# Patient Record
Sex: Female | Born: 1995 | Race: Black or African American | Hispanic: No | Marital: Single | Smoking: Never smoker
Health system: Southern US, Community
[De-identification: ages and names within clinical notes are randomized; demographics above are authoritative.]

## PROBLEM LIST (undated history)

## (undated) DIAGNOSIS — G43909 Migraine, unspecified, not intractable, without status migrainosus: Secondary | ICD-10-CM

## (undated) DIAGNOSIS — T7840XA Allergy, unspecified, initial encounter: Secondary | ICD-10-CM

## (undated) DIAGNOSIS — F909 Attention-deficit hyperactivity disorder, unspecified type: Secondary | ICD-10-CM

## (undated) HISTORY — PX: MASTECTOMY: SHX3

---
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
## 2017-11-23 ENCOUNTER — Other Ambulatory Visit: Payer: Self-pay

## 2017-11-23 ENCOUNTER — Encounter (HOSPITAL_BASED_OUTPATIENT_CLINIC_OR_DEPARTMENT_OTHER): Payer: Self-pay | Admitting: *Deleted

## 2017-11-23 ENCOUNTER — Emergency Department (HOSPITAL_BASED_OUTPATIENT_CLINIC_OR_DEPARTMENT_OTHER)
Admission: EM | Admit: 2017-11-23 | Discharge: 2017-11-23 | Disposition: A | Discharge status: Home / Self Care | Payer: 59 | Attending: Emergency Medicine | Admitting: Emergency Medicine

## 2017-11-23 DIAGNOSIS — J069 Acute upper respiratory infection, unspecified: Secondary | ICD-10-CM

## 2017-11-23 DIAGNOSIS — R0981 Nasal congestion: Secondary | ICD-10-CM | POA: Diagnosis present

## 2017-11-23 DIAGNOSIS — R300 Dysuria: Secondary | ICD-10-CM | POA: Diagnosis not present

## 2017-11-23 LAB — URINALYSIS, ROUTINE W REFLEX MICROSCOPIC
Bilirubin Urine: NEGATIVE
Glucose, UA: NEGATIVE mg/dL
KETONES UR: NEGATIVE mg/dL
LEUKOCYTES UA: NEGATIVE
Nitrite: NEGATIVE
PROTEIN: NEGATIVE mg/dL
Specific Gravity, Urine: 1.015 (ref 1.005–1.030)
pH: 7.5 (ref 5.0–8.0)

## 2017-11-23 LAB — URINALYSIS, MICROSCOPIC (REFLEX)

## 2017-11-23 MED ORDER — FLUTICASONE FUROATE 50 MCG/ACT IN AEPB: 1.0000 | INHALATION_SPRAY | Freq: Two times a day (BID) | RESPIRATORY_TRACT | 0 refills | Status: AC

## 2017-11-23 NOTE — ED Notes (Signed)
Denies fever, denies bodyaches. C/o "cramps". States she is Nexplanon and it is due for replacement in a couple months

## 2017-11-23 NOTE — ED Provider Notes (Signed)
[facility] EMERGENCY DEPARTMENT Provider Note   CSN: [id number] Arrival date & time: 11/23/17  1337     History   Chief Complaint Chief Complaint  Patient presents with  . Cough    HPI Joan Espinoza is a 22 y.o. female with no significant medical history who presents for evaluation of congestion and dysuria.  Patient states she has had nasal congestion x2 days.  Patient states she has difficulty breathing out of her nose due to rhinorrhea and congestion.  States she feels like when she lays down that her nasal discharge trips to the back of her throat causes her to cough.  Cough is not productive of green or yellow sputum.  Denies fever, chills, sore throat, chest pain, shortness of breath, abdominal pain.  Admits to dysuria x1 day, Denies vaginal discharge, pelvic pain..  Denies flank pain.  Patient states she has a Nexplanon, however it is due to be replaced.  Denies any pain, difficulty swallowing, pain with swallowing, difficulty tolerating secretions..  Denies aggravating or alleviating factors.  Patient states she has not taken anything OTC for her symptoms.  History obtained from patient.  No interpreter was used.  HPI  Past Medical History:  Diagnosis Date  . ADHD (attention deficit hyperactivity disorder)    ADHD  . Allergy    Seasonal     There are no active problems to display for this patient.   Past Surgical History:  Procedure Laterality Date  . BREAST REDUCTION SURGERY Bilateral 10/22/2014   Procedure: BILATERAL BREAST REDUCTION ;  Surgeon: Eloise Levels, MD;  Location: [facility];  Service: Plastics;  Laterality: Bilateral;     OB History   None      Home Medications    Prior to Admission medications   Medication Sig Start Date End Date Taking? Authorizing Provider  Fluticasone Furoate 50 MCG/ACT AEPB Place 1 spray into both nostrils 2 (two) times daily for 7 days. 11/23/17 11/30/17  Obediah Welles A, PA-C    NEXPLANON 68 MG IMPL implant 68 mg by Implant route continuous. 09/03/15   [provider]    Family History Family History  Problem Relation Age of Onset  . Heart disease Maternal Grandmother   . Hypertension Maternal Grandmother   . Diabetes Paternal Grandmother   . Hypertension Paternal Grandfather     Social History Social History   Tobacco Use  . Smoking status: Never Smoker  . Smokeless tobacco: Never Used  Substance Use Topics  . Alcohol use: No  . Drug use: Yes    Types: Marijuana     Allergies   Patient has no known allergies.   Review of Systems Review of Systems  Constitutional: Negative.   HENT: Positive for congestion, postnasal drip and rhinorrhea. Negative for dental problem, drooling, ear discharge, ear pain, facial swelling, hearing loss, mouth sores, nosebleeds, sinus pressure, sinus pain, sneezing, sore throat, tinnitus, trouble swallowing and voice change.   Respiratory: Negative.   Cardiovascular: Negative.   Gastrointestinal: Negative.   Genitourinary: Positive for dysuria. Negative for difficulty urinating, flank pain, hematuria, menstrual problem, pelvic pain, vaginal bleeding, vaginal discharge and vaginal pain.  Musculoskeletal: Negative.   Skin: Negative.   All other systems reviewed and are negative.    Physical Exam Updated Vital Signs BP 129/87 (BP Location: Right Arm)   Pulse 87   Temp 99.2 F (37.3 C) (Oral)   Resp 20   Ht 5\' 5"  (1.651 m)   Wt (!) 139.5  kg   LMP 10/21/2017   SpO2 100%   BMI 51.17 kg/m   Physical Exam  Constitutional: Vital signs are normal. She appears well-developed and well-nourished.  Non-toxic appearance. She does not have a sickly appearance. She does not appear ill. No distress.  HENT:  Head: Normocephalic and atraumatic.  Right Ear: Tympanic membrane, external ear and ear canal normal. No drainage or tenderness. Tympanic membrane is not injected, not scarred, not perforated, not  erythematous, not retracted and not bulging.  Left Ear: Tympanic membrane, external ear and ear canal normal. No drainage or tenderness. Tympanic membrane is not injected, not scarred, not perforated, not erythematous, not retracted and not bulging.  Nose: Mucosal edema and rhinorrhea present. No sinus tenderness, nasal deformity, septal deviation or nasal septal hematoma. No epistaxis.  No foreign bodies. Right sinus exhibits no maxillary sinus tenderness and no frontal sinus tenderness. Left sinus exhibits no maxillary sinus tenderness and no frontal sinus tenderness.  Mouth/Throat: Uvula is midline and mucous membranes are normal. No oral lesions. No trismus in the jaw. No dental abscesses or uvula swelling. Posterior oropharyngeal erythema present. No oropharyngeal exudate, posterior oropharyngeal edema or tonsillar abscesses. No tonsillar exudate.  Eyes: Pupils are equal, round, and reactive to light.  Neck: Normal range of motion, full passive range of motion without pain and phonation normal. Neck supple. No spinous process tenderness and no muscular tenderness present. No neck rigidity. No edema, no erythema and normal range of motion present.  Cardiovascular: Normal rate, normal heart sounds, intact distal pulses and normal pulses.  Pulmonary/Chest: Effort normal and breath sounds normal. No accessory muscle usage or stridor. No tachypnea. No respiratory distress. She has no decreased breath sounds. She has no wheezes. She has no rhonchi. She has no rales. She exhibits no tenderness and no crepitus.  Abdominal: Soft. Normal appearance and bowel sounds are normal. She exhibits no distension, no fluid wave and no ascites. There is no hepatosplenomegaly. There is no tenderness. There is no rigidity, no rebound, no guarding and no CVA tenderness.  Musculoskeletal: Normal range of motion.       Lumbar back: Normal.  Neurological: She is alert.  Skin: Skin is warm and dry. No rash noted. She is not  diaphoretic.  Psychiatric: She has a normal mood and affect.  Nursing note and vitals reviewed.    ED Treatments / Results  Labs (all labs ordered are listed, but only abnormal results are displayed) Labs Reviewed  URINALYSIS, ROUTINE W REFLEX MICROSCOPIC - Abnormal; Notable for the following components:      Result Value   Hgb urine dipstick SMALL (*)    All other components within normal limits  URINALYSIS, MICROSCOPIC (REFLEX) - Abnormal; Notable for the following components:   Bacteria, UA RARE (*)    All other components within normal limits  PREGNANCY, URINE    EKG None  Radiology No results found.  Procedures Procedures (including critical care time)  Medications Ordered in ED Medications - No data to display   Initial Impression / Assessment and Plan / ED Course  I have reviewed the triage vital signs and the nursing notes.  Pertinent labs & imaging results that were available during my care of the patient were reviewed by me and considered in my medical decision making (see chart for details).  22 year old female who appears otherwise well presents for evaluation of upper respiratory symptoms and dysuria.  Afebrile, nonseptic, non-ill-appearing.  Patient with congestion and rhinorrhea on exam.  Posterior oropharynx  erythematous without edema.  No tonsillar edema, erythema or exudates.  Uvula is midline.  Neck without rigidity and nontender.  Low suspicion for PTA, Ludwig's angina.  No submandibular swelling or facial brawning.  Exam consistent with upper respiratory infection. Lungs clear to ascultation bilaterally, cough only present with nasal congestion to back of throat. Low suspicion for pneumonia. Patient with dysuria.  Abdomen nontender without rebound or guarding.  No pelvic pain or vaginal discharge.  Patient states she is not concerned about STDs.  Will obtain urinalysis and reevaluate.  Urinalysis negative for infection, urine pregnancy pending at D/C.  Patient does not want to wait for result. Requesting D/C home at this time. Discussed risk vs benefit and patient continues to request dc home at this time. Does not was STD testing. Patient is nontoxic, nonseptic appearing, in no apparent distress.  Patient does not meet the SIRS or Sepsis criteria.  On repeat exam patient does not have a surgical abdomin and there are no peritoneal signs.  No indication of appendicitis, bowel obstruction, bowel perforation, cholecystitis, diverticulitis. Patient symptoms consistent with upper respiratory infection.  Discussed with patient this does not need antibiotics.  Will treat with symptomatic management and outpatient follow-up with PCP.  Discussed reasons to return to the emergency department.  Patient voiced understanding is agreeable for follow-up.  Patient hemodynamically stable and appropriate for DC home at this time    Final Clinical Impressions(s) / ED Diagnoses   Final diagnoses:  URI (upper respiratory infection)    ED Discharge Orders         Ordered    Fluticasone Furoate 50 MCG/ACT AEPB  2 times daily     11/23/17 1612           Naylee Frankowski A, PA-C 11/23/17 1619    Tilden Fossa, MD 11/24/17 [phone]

## 2017-11-23 NOTE — Discharge Instructions (Addendum)
You were evaluated today for upper respiratory symptoms. This is most likely viral in nature. You may take over the counter musinex for symptoms management. I have also prescribed Flonase for your nasal symptoms.  Urinalysis was negative for infection.  If your symptoms worsen please see PCP for reevaluation.  Return to the ED for any new or worsening symptoms

## 2017-11-23 NOTE — ED Triage Notes (Signed)
Pt c/o URI and n/d x 2 days

## 2018-07-23 IMAGING — CR DG CHEST 2V
2 series · 2 of 2 positions shown · non-contrast
Comparison: 02/06/2015 chest radiograph

CLINICAL DATA: 20-year-old female with cough and congestion for 2
weeks.

EXAM:
CHEST  2 VIEW

[w chest pa]
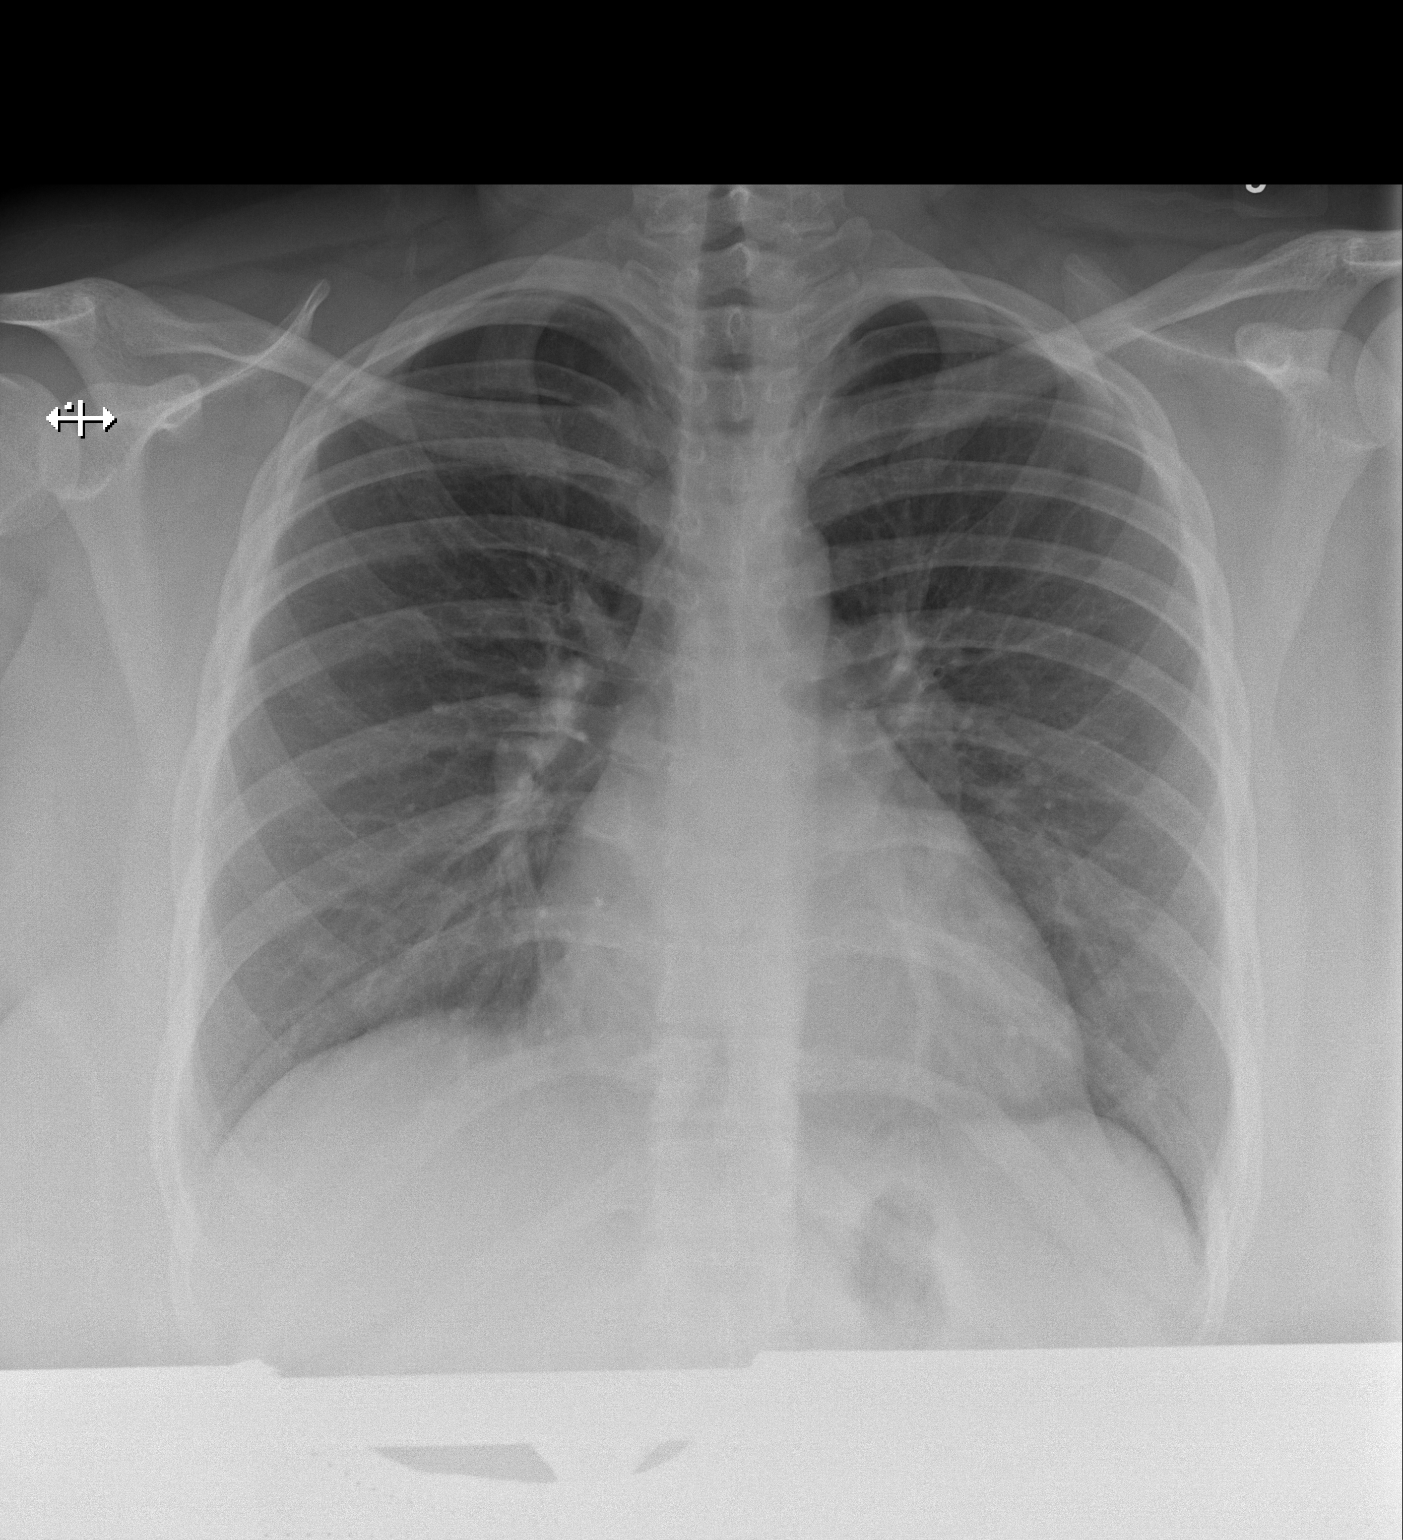

[w chest lat]
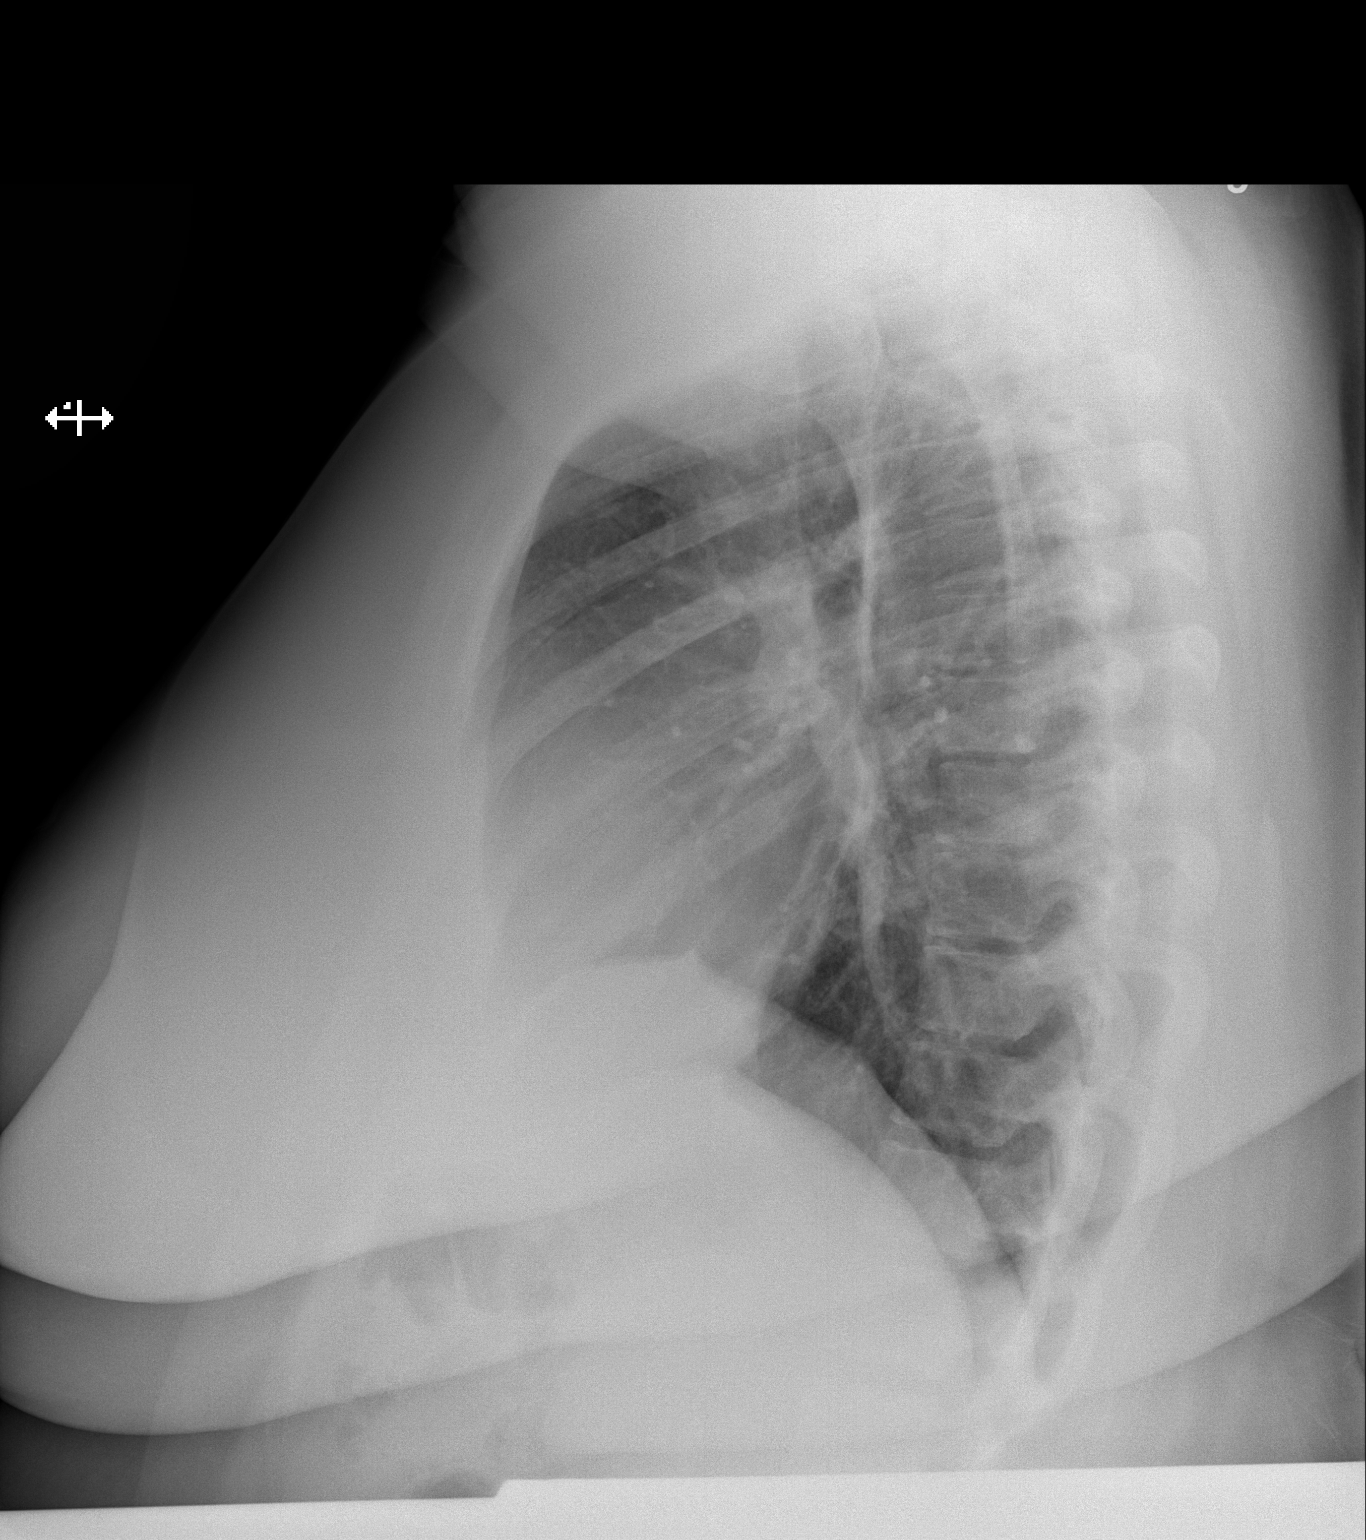

[2 of 2 positions shown; findings below may reference images not displayed]

FINDINGS: Upper limits normal heart size noted.

There is no evidence of focal airspace disease, pulmonary edema,
suspicious pulmonary nodule/mass, pleural effusion, or pneumothorax.
No acute bony abnormalities are identified.
IMPRESSION: Upper limits normal heart size without evidence of acute
cardiopulmonary disease.

## 2022-05-29 ENCOUNTER — Encounter (HOSPITAL_BASED_OUTPATIENT_CLINIC_OR_DEPARTMENT_OTHER): Payer: Self-pay

## 2022-05-29 ENCOUNTER — Other Ambulatory Visit: Payer: Self-pay

## 2022-05-29 ENCOUNTER — Emergency Department (HOSPITAL_BASED_OUTPATIENT_CLINIC_OR_DEPARTMENT_OTHER)
Admission: EM | Admit: 2022-05-29 | Discharge: 2022-05-29 | Disposition: A | Discharge status: Home / Self Care | Payer: Medicaid Other | Attending: Emergency Medicine | Admitting: Emergency Medicine

## 2022-05-29 ENCOUNTER — Emergency Department (HOSPITAL_BASED_OUTPATIENT_CLINIC_OR_DEPARTMENT_OTHER): Payer: Medicaid Other

## 2022-05-29 DIAGNOSIS — S83005A Unspecified dislocation of left patella, initial encounter: Secondary | ICD-10-CM | POA: Diagnosis not present

## 2022-05-29 DIAGNOSIS — S83006A Unspecified dislocation of unspecified patella, initial encounter: Secondary | ICD-10-CM

## 2022-05-29 DIAGNOSIS — X501XXA Overexertion from prolonged static or awkward postures, initial encounter: Secondary | ICD-10-CM | POA: Diagnosis not present

## 2022-05-29 DIAGNOSIS — S8992XA Unspecified injury of left lower leg, initial encounter: Secondary | ICD-10-CM | POA: Diagnosis present

## 2022-05-29 MED ORDER — KETOROLAC TROMETHAMINE 30 MG/ML IJ SOLN
30.0000 mg | Freq: Once | INTRAMUSCULAR | Status: AC
  Administered 2022-05-29: 30 mg via INTRAMUSCULAR
  Filled 2022-05-29: qty 1

## 2022-05-29 MED ORDER — HYDROCODONE-ACETAMINOPHEN 5-325 MG PO TABS: 1.0000 | ORAL_TABLET | ORAL | 0 refills | Status: AC | PRN

## 2022-05-29 MED ORDER — IBUPROFEN 600 MG PO TABS: 600.0000 mg | ORAL_TABLET | Freq: Four times a day (QID) | ORAL | 0 refills | Status: AC | PRN

## 2022-05-29 NOTE — ED Triage Notes (Signed)
Patient was getting out of the shower and felt a pop in her left knee. She stated it was not "in place" and she had to moved it "back into place" last night. Now having pain.

## 2022-05-29 NOTE — ED Provider Notes (Signed)
[place] [facility] AT [facility] Provider Note   CSN: [id number] Arrival date & time: 05/29/22  1056     History  Chief Complaint  Patient presents with   Knee Pain    Joan Espinoza is a 27 y.o. female.  Pt is a 27 yo female with no significant pmhx.  Pt said her knee has dislocated several times.  When asked to clarify, it is her knee cap.  She has always been able to put it back into place.  It popped back out of place last night and she reduced it.  She has pain in her knee.  She is able to ambulate.       Home Medications Prior to Admission medications   Medication Sig Start Date End Date Taking? Authorizing Provider  HYDROcodone-acetaminophen (NORCO/VICODIN) 5-325 MG tablet Take 1 tablet by mouth every 4 (four) hours as needed. 05/29/22  Yes Jacalyn Lefevre, MD  ibuprofen (ADVIL) 600 MG tablet Take 1 tablet (600 mg total) by mouth every 6 (six) hours as needed. 05/29/22  Yes Jacalyn Lefevre, MD  NEXPLANON 68 MG IMPL implant 68 mg by Implant route continuous. 09/03/15   [provider]      Allergies    Patient has no known allergies.    Review of Systems   Review of Systems  Musculoskeletal:        Left knee pain  All other systems reviewed and are negative.   Physical Exam Updated Vital Signs BP (!) 159/101   Pulse 94   Temp 97.9 F (36.6 C) (Tympanic)   Resp 18   Ht 5\' 4"  (1.626 m)   Wt 136.1 kg   LMP 05/03/2022   SpO2 98%   BMI 51.49 kg/m  Physical Exam Vitals and nursing note reviewed.  Constitutional:      Appearance: Normal appearance. She is obese.  HENT:     Head: Normocephalic and atraumatic.     Right Ear: External ear normal.     Left Ear: External ear normal.     Nose: Nose normal.     Mouth/Throat:     Mouth: Mucous membranes are moist.     Pharynx: Oropharynx is clear.  Eyes:     Extraocular Movements: Extraocular movements intact.     Conjunctiva/sclera: Conjunctivae normal.     Pupils: Pupils are  equal, round, and reactive to light.  Cardiovascular:     Rate and Rhythm: Normal rate and regular rhythm.     Pulses: Normal pulses.     Heart sounds: Normal heart sounds.  Pulmonary:     Effort: Pulmonary effort is normal.     Breath sounds: Normal breath sounds.  Abdominal:     General: Abdomen is flat. Bowel sounds are normal.     Palpations: Abdomen is soft.  Musculoskeletal:     Cervical back: Normal range of motion and neck supple.     Comments: Pt is able to extend her knee, but it hurts.  No deformities.  Skin:    General: Skin is warm.     Capillary Refill: Capillary refill takes less than 2 seconds.  Neurological:     General: No focal deficit present.     Mental Status: She is alert and oriented to person, place, and time.  Psychiatric:        Mood and Affect: Mood normal.        Behavior: Behavior normal.     ED Results / Procedures / Treatments  Labs (all labs ordered are listed, but only abnormal results are displayed) Labs Reviewed - No data to display  EKG None  Radiology DG Knee Complete 4 Views Left  Result Date: 05/29/2022 CLINICAL DATA:  Larey Seat.  Twisting injury.  Left knee pain. EXAM: LEFT KNEE - COMPLETE 4+ VIEW COMPARISON:  None Available. FINDINGS: No acute fracture or dislocation. No large joint effusion. Degraded evaluation secondary to patient's size. IMPRESSION: No acute osseous abnormality. Electronically Signed   By: Jeronimo Greaves M.D.   On: 05/29/2022 12:14    Procedures Procedures    Medications Ordered in ED Medications  ketorolac (TORADOL) 30 MG/ML injection 30 mg (30 mg Intramuscular Given 05/29/22 1201)    ED Course/ Medical Decision Making/ A&P                             Medical Decision Making Risk Prescription drug management.   This patient presents to the ED for concern of knee pain, this involves an extensive number of treatment options, and is a complaint that carries with it a high risk of complications and morbidity.   The differential diagnosis includes fx, ligamentous injury   Co morbidities that complicate the patient evaluation  none   Additional history obtained:  Additional history obtained from epic chart review  Imaging Studies ordered:  I ordered imaging studies including left knee  I independently visualized and interpreted imaging which showed No acute osseous abnormality.  I agree with the radiologist interpretation   Medicines ordered and prescription drug management:  I ordered medication including toradol  for pain  Reevaluation of the patient after these medicines showed that the patient improved I have reviewed the patients home medicines and have made adjustments as needed   Test Considered:  xr  Problem List / ED Course:  Patella d/l:  pt placed in a brace.  She is given knee exercises.  She is to f/u with ortho. Return if worse.   Reevaluation:  After the interventions noted above, I reevaluated the patient and found that they have :improved   Social Determinants of Health:  Lives at home; self pay   Dispostion:  After consideration of the diagnostic results and the patients response to treatment, I feel that the patent would benefit from discharge with outpatient f/u.          Final Clinical Impression(s) / ED Diagnoses Final diagnoses:  Patellar dislocation, initial encounter    Rx / DC Orders ED Discharge Orders          Ordered    ibuprofen (ADVIL) 600 MG tablet  Every 6 hours PRN        05/29/22 1159    HYDROcodone-acetaminophen (NORCO/VICODIN) 5-325 MG tablet  Every 4 hours PRN        05/29/22 1159              Jacalyn Lefevre, MD 05/29/22 1225

## 2022-05-31 ENCOUNTER — Telehealth (HOSPITAL_BASED_OUTPATIENT_CLINIC_OR_DEPARTMENT_OTHER): Payer: Self-pay

## 2022-05-31 NOTE — Telephone Encounter (Signed)
Patient called requesting work note be provided.

## 2022-06-05 ENCOUNTER — Encounter (HOSPITAL_BASED_OUTPATIENT_CLINIC_OR_DEPARTMENT_OTHER): Payer: Self-pay | Admitting: Emergency Medicine

## 2022-06-05 ENCOUNTER — Emergency Department (HOSPITAL_BASED_OUTPATIENT_CLINIC_OR_DEPARTMENT_OTHER)
Admission: EM | Admit: 2022-06-05 | Discharge: 2022-06-05 | Disposition: A | Discharge status: Home / Self Care | Payer: Medicaid Other | Attending: Emergency Medicine | Admitting: Emergency Medicine

## 2022-06-05 ENCOUNTER — Emergency Department (HOSPITAL_BASED_OUTPATIENT_CLINIC_OR_DEPARTMENT_OTHER): Payer: Medicaid Other

## 2022-06-05 ENCOUNTER — Other Ambulatory Visit: Payer: Self-pay

## 2022-06-05 DIAGNOSIS — J019 Acute sinusitis, unspecified: Secondary | ICD-10-CM | POA: Insufficient documentation

## 2022-06-05 DIAGNOSIS — Z1152 Encounter for screening for COVID-19: Secondary | ICD-10-CM | POA: Diagnosis not present

## 2022-06-05 DIAGNOSIS — R059 Cough, unspecified: Secondary | ICD-10-CM | POA: Diagnosis present

## 2022-06-05 LAB — RESP PANEL BY RT-PCR (RSV, FLU A&B, COVID)  RVPGX2
Influenza A by PCR: NEGATIVE
Influenza B by PCR: NEGATIVE
Resp Syncytial Virus by PCR: NEGATIVE
SARS Coronavirus 2 by RT PCR: NEGATIVE

## 2022-06-05 MED ORDER — OXYMETAZOLINE HCL 0.05 % NA SOLN
1.0000 | Freq: Once | NASAL | Status: AC
  Administered 2022-06-05: 1 via NASAL
  Filled 2022-06-05: qty 30

## 2022-06-05 MED ORDER — KETOROLAC TROMETHAMINE 60 MG/2ML IM SOLN
60.0000 mg | Freq: Once | INTRAMUSCULAR | Status: AC
  Administered 2022-06-05: 60 mg via INTRAMUSCULAR
  Filled 2022-06-05: qty 2

## 2022-06-05 MED ORDER — PREDNISONE 50 MG PO TABS: 60.0000 mg | ORAL_TABLET | Freq: Once | ORAL | Status: DC

## 2022-06-05 MED ORDER — DEXAMETHASONE 4 MG PO TABS
10.0000 mg | ORAL_TABLET | Freq: Once | ORAL | Status: AC
  Administered 2022-06-05: 10 mg via ORAL
  Filled 2022-06-05: qty 3

## 2022-06-05 MED ORDER — AMOXICILLIN-POT CLAVULANATE 875-125 MG PO TABS: 1.0000 | ORAL_TABLET | Freq: Two times a day (BID) | ORAL | 0 refills | Status: AC

## 2022-06-05 MED ORDER — DIPHENHYDRAMINE HCL 25 MG PO CAPS
25.0000 mg | ORAL_CAPSULE | Freq: Once | ORAL | Status: AC
  Administered 2022-06-05: 25 mg via ORAL
  Filled 2022-06-05: qty 1

## 2022-06-05 MED ORDER — PROCHLORPERAZINE EDISYLATE 10 MG/2ML IJ SOLN
10.0000 mg | Freq: Once | INTRAMUSCULAR | Status: AC
  Administered 2022-06-05: 10 mg via INTRAMUSCULAR
  Filled 2022-06-05: qty 2

## 2022-06-05 NOTE — ED Notes (Signed)
Discharge instructions reviewed with patient. Patient questions answered and opportunity for education reviewed. Patient voices understanding of discharge instructions with no further questions. Patient ambulatory with steady gait to lobby.  

## 2022-06-05 NOTE — ED Triage Notes (Addendum)
Pt c/o HA since last Sat (was seen here for fall that day); sts OTC meds not helping; also reports productive cough

## 2022-06-05 NOTE — ED Notes (Addendum)
Patient transported to XR. 

## 2022-06-05 NOTE — ED Provider Notes (Addendum)
[facility] Provider Note   CSN: [id number] Arrival date & time: 06/05/22  1359     History  Chief Complaint  Patient presents with   Headache    Joan Espinoza is a 27 y.o. female.  Patient here with sinus congestion, cough, headache, stuffy nose.  Symptoms now for the last 5 or 6 days.  Denies any fevers.  Denies any chest pain or shortness of breath.  Some ear discomfort as well.  Throat has been sore at times.  Denies any abdominal pain nausea vomit diarrhea.  The history is provided by the patient.       Home Medications Prior to Admission medications   Medication Sig Start Date End Date Taking? Authorizing Provider  amoxicillin-clavulanate (AUGMENTIN) 875-125 MG tablet Take 1 tablet by mouth every 12 (twelve) hours. 06/05/22  Yes Deneshia Zucker, DO  HYDROcodone-acetaminophen (NORCO/VICODIN) 5-325 MG tablet Take 1 tablet by mouth every 4 (four) hours as needed. 05/29/22   Jacalyn Lefevre, MD  ibuprofen (ADVIL) 600 MG tablet Take 1 tablet (600 mg total) by mouth every 6 (six) hours as needed. 05/29/22   Jacalyn Lefevre, MD  NEXPLANON 68 MG IMPL implant 68 mg by Implant route continuous. 09/03/15   [provider]      Allergies    Patient has no known allergies.    Review of Systems   Review of Systems  Physical Exam Updated Vital Signs BP (!) 147/104   Pulse 96   Temp 98.5 F (36.9 C) (Temporal)   Resp 20   Ht 5\' 4"  (1.626 m)   Wt (!) 136.1 kg   LMP 05/03/2022   SpO2 99%   BMI 51.50 kg/m  Physical Exam Vitals and nursing note reviewed.  Constitutional:      General: She is not in acute distress.    Appearance: She is well-developed. She is not ill-appearing.  HENT:     Head: Normocephalic and atraumatic.     Comments: Nasal congestion, TMs mildly erythematous, oropharynx is clear with no major erythema or abscess    Mouth/Throat:     Mouth: Mucous membranes are moist.  Eyes:     Extraocular Movements:  Extraocular movements intact.     Conjunctiva/sclera: Conjunctivae normal.  Cardiovascular:     Rate and Rhythm: Normal rate and regular rhythm.     Heart sounds: Normal heart sounds. No murmur heard. Pulmonary:     Effort: Pulmonary effort is normal. No respiratory distress.     Breath sounds: Normal breath sounds.  Abdominal:     Palpations: Abdomen is soft.     Tenderness: There is no abdominal tenderness.  Musculoskeletal:        General: No swelling.     Cervical back: Normal range of motion and neck supple.  Skin:    General: Skin is warm and dry.     Capillary Refill: Capillary refill takes less than 2 seconds.  Neurological:     Mental Status: She is alert.     Comments: 5+ out of 5 strength throughout, normal sensation, no drift, normal finger-nose-finger, normal speech  Psychiatric:        Mood and Affect: Mood normal.     ED Results / Procedures / Treatments   Labs (all labs ordered are listed, but only abnormal results are displayed) Labs Reviewed  RESP PANEL BY RT-PCR (RSV, FLU A&B, COVID)  [id number]    EKG None  Radiology DG Chest 2 View  Result  Date: 06/05/2022 CLINICAL DATA:  Cough. Headache. EXAM: CHEST - 2 VIEW COMPARISON:  12/05/2015 FINDINGS: Low lung volumes on the frontal view. The cardiomediastinal contours are normal. Mild bronchial thickening. Pulmonary vasculature is normal. No consolidation, pleural effusion, or pneumothorax. No acute osseous abnormalities are seen. IMPRESSION: Mild bronchial thickening. Electronically Signed   By: Narda Rutherford M.D.   On: 06/05/2022 15:49    Procedures Procedures    Medications Ordered in ED Medications  oxymetazoline (AFRIN) 0.05 % nasal spray 1 spray (has no administration in time range)  prochlorperazine (COMPAZINE) injection 10 mg (has no administration in time range)  diphenhydrAMINE (BENADRYL) capsule 25 mg (has no administration in time range)  ketorolac (TORADOL) injection 60 mg (60 mg Intramuscular  Given 06/05/22 1536)  dexamethasone (DECADRON) tablet 10 mg (10 mg Oral Given 06/05/22 1536)    ED Course/ Medical Decision Making/ A&P                             Medical Decision Making Amount and/or Complexity of Data Reviewed Radiology: ordered.  Risk OTC drugs. Prescription drug management.   [name] is here with URI symptoms.  Normal vitals.  No fever.  Normal neurological exam.  Differential diagnosis likely sinusitis versus viral process.  Will check COVID and flu test.  Will give Decadron, Toradol, Augmentin.  She states she just had her menstrual cycle.  She is not concerned about being pregnant.  Overall I have no concern for neurologic process or other acute pulmonary cardiac process.  Chest x-ray showed no obvious pneumonia or pneumothorax per my review and interpretation.  Per radiology report does show mild bronchial thickening which I suspect is from viral process that is ongoing right now.  Patient also given Compazine, Afrin, Benadryl to help with nasal congestion and headache.  Patient discharged in good condition.  Recommend continued use of Tylenol and ibuprofen at home.  This chart was dictated using voice recognition software.  Despite best efforts to proofread,  errors can occur which can change the documentation meaning.         Final Clinical Impression(s) / ED Diagnoses Final diagnoses:  Acute sinusitis, recurrence not specified, unspecified location    Rx / DC Orders ED Discharge Orders          Ordered    amoxicillin-clavulanate (AUGMENTIN) 875-125 MG tablet  Every 12 hours        06/05/22 1539              Virgina Norfolk, DO 06/05/22 1539    Jaymarion Trombly, DO 06/05/22 1601

## 2022-06-05 NOTE — Discharge Instructions (Addendum)
Follow-up your COVID and flu test on your MyChart.  You have been treated with a long-acting steroid.  Recommend continued use of Tylenol and ibuprofen at home.  1000 mg of Tylenol every 6 hours as needed for pain.  800 mg ibuprofen every 8 hours as needed for pain.  Take antibiotic as prescribed.  Continue to use Afrin nasal spray that we provided you with 1 spray in each nare twice a day for the next 3 days.

## 2022-09-27 ENCOUNTER — Other Ambulatory Visit: Payer: Self-pay

## 2022-09-27 ENCOUNTER — Emergency Department (HOSPITAL_BASED_OUTPATIENT_CLINIC_OR_DEPARTMENT_OTHER)
Admission: EM | Admit: 2022-09-27 | Discharge: 2022-09-27 | Disposition: A | Discharge status: Home / Self Care | Payer: Medicaid Other | Attending: Emergency Medicine | Admitting: Emergency Medicine

## 2022-09-27 ENCOUNTER — Emergency Department (HOSPITAL_BASED_OUTPATIENT_CLINIC_OR_DEPARTMENT_OTHER): Payer: Medicaid Other

## 2022-09-27 ENCOUNTER — Encounter (HOSPITAL_BASED_OUTPATIENT_CLINIC_OR_DEPARTMENT_OTHER): Payer: Self-pay

## 2022-09-27 DIAGNOSIS — Z20822 Contact with and (suspected) exposure to covid-19: Secondary | ICD-10-CM | POA: Diagnosis not present

## 2022-09-27 DIAGNOSIS — R519 Headache, unspecified: Secondary | ICD-10-CM | POA: Diagnosis present

## 2022-09-27 DIAGNOSIS — R079 Chest pain, unspecified: Secondary | ICD-10-CM | POA: Insufficient documentation

## 2022-09-27 DIAGNOSIS — Z5329 Procedure and treatment not carried out because of patient's decision for other reasons: Secondary | ICD-10-CM

## 2022-09-27 HISTORY — DX: Migraine, unspecified, not intractable, without status migrainosus: G43.909

## 2022-09-27 LAB — CBC
HCT: 38.9 % (ref 36.0–46.0)
Hemoglobin: 12.4 g/dL (ref 12.0–15.0)
MCH: 29.4 pg (ref 26.0–34.0)
MCHC: 31.9 g/dL (ref 30.0–36.0)
MCV: 92.2 fL (ref 80.0–100.0)
Platelets: 316 10*3/uL (ref 150–400)
RBC: 4.22 MIL/uL (ref 3.87–5.11)
RDW: 13.4 % (ref 11.5–15.5)
WBC: 6.5 10*3/uL (ref 4.0–10.5)
nRBC: 0 % (ref 0.0–0.2)

## 2022-09-27 LAB — BASIC METABOLIC PANEL
Anion gap: 6 (ref 5–15)
BUN: 13 mg/dL (ref 6–20)
CO2: 26 mmol/L (ref 22–32)
Calcium: 8.7 mg/dL — ABNORMAL LOW (ref 8.9–10.3)
Chloride: 106 mmol/L (ref 98–111)
Creatinine, Ser: 0.67 mg/dL (ref 0.44–1.00)
GFR, Estimated: >60 mL/min (ref >60)
Glucose, Bld: 93 mg/dL (ref 70–99)
Potassium: 4.1 mmol/L (ref 3.5–5.1)
Sodium: 138 mmol/L (ref 135–145)

## 2022-09-27 LAB — TROPONIN I (HIGH SENSITIVITY): Troponin I (High Sensitivity): 2 ng/L (ref <18)

## 2022-09-27 LAB — PREGNANCY, URINE: Preg Test, Ur: NEGATIVE

## 2022-09-27 LAB — SARS CORONAVIRUS 2 BY RT PCR: SARS Coronavirus 2 by RT PCR: NEGATIVE

## 2022-09-27 MED ORDER — KETOROLAC TROMETHAMINE 15 MG/ML IJ SOLN
15.0000 mg | Freq: Once | INTRAMUSCULAR | Status: AC
  Administered 2022-09-27: 15 mg via INTRAVENOUS
  Filled 2022-09-27: qty 1

## 2022-09-27 MED ORDER — ACETAMINOPHEN 500 MG PO TABS
1000.0000 mg | ORAL_TABLET | Freq: Once | ORAL | Status: AC
  Administered 2022-09-27: 1000 mg via ORAL
  Filled 2022-09-27: qty 2

## 2022-09-27 MED ORDER — METOCLOPRAMIDE HCL 5 MG/ML IJ SOLN
10.0000 mg | Freq: Once | INTRAMUSCULAR | Status: AC
  Administered 2022-09-27: 10 mg via INTRAVENOUS
  Filled 2022-09-27: qty 2

## 2022-09-27 NOTE — ED Notes (Signed)
ED Provider at bedside. 

## 2022-09-27 NOTE — ED Notes (Signed)
Patient c/o discomfort on overall area of IV. Noted site is soft and no redness or swelling, IV flushes with no resistance and has good blood return. Medication administered as ordered with no issue, removed IV per pt request. Counseled that if more blood work and or medications are needed, IV might need to be reinserted.

## 2022-09-27 NOTE — ED Triage Notes (Signed)
Pt presents with complaint of left sided headache since Friday. Hx of migraines that began this year Woke up this morning with left sided chest pain. Pain radiates to epigastric area. States she is SOB, no distress noted

## 2022-09-27 NOTE — ED Notes (Signed)
Patient approached nurses station stated she needs to go. Redirected patient back to her assigned room.

## 2022-09-27 NOTE — ED Provider Notes (Signed)
[place] [facility] Provider Note   CSN: [id number] Arrival date & time: 09/27/22  2595     History  Chief Complaint  Patient presents with   Headache    Joan Espinoza is a 27 y.o. female.  Patient is a 27 year old female with no significant past medical history presenting to the emergency department with headache and chest pain.  Patient states since Friday she has been having on and off headaches that have been coming and going.  She states that they are mostly around the left side of her head and radiate across her forehead.  She denies any vision changes, numbness or weakness, nausea or vomiting.  She does endorse photophobia and phonophobia.  The patient states that she did go out drinking last night and then woke up around 4 AM with chest pressure.  She states that she was able to go back to sleep and when she woke up later this morning the pain was still there and has been coming and going throughout the morning.  She states that she has some mild shortness of breath.  She states that she also has had a nonproductive cough recently.  She denies any fevers or neck pain.  The history is provided by the patient.  Headache      Home Medications Prior to Admission medications   Medication Sig Start Date End Date Taking? Authorizing Provider  amoxicillin-clavulanate (AUGMENTIN) 875-125 MG tablet Take 1 tablet by mouth every 12 (twelve) hours. 06/05/22   Curatolo, Adam, DO  HYDROcodone-acetaminophen (NORCO/VICODIN) 5-325 MG tablet Take 1 tablet by mouth every 4 (four) hours as needed. 05/29/22   Jacalyn Lefevre, MD  ibuprofen (ADVIL) 600 MG tablet Take 1 tablet (600 mg total) by mouth every 6 (six) hours as needed. 05/29/22   Jacalyn Lefevre, MD  NEXPLANON 68 MG IMPL implant 68 mg by Implant route continuous. 09/03/15   [provider]      Allergies    Patient has no known allergies.    Review of Systems   Review of Systems   Neurological:  Positive for headaches.    Physical Exam Updated Vital Signs BP (!) 168/113   Pulse 88   Temp 97.7 F (36.5 C) (Oral)   Resp 16   LMP 09/03/2022   SpO2 100%  Physical Exam Vitals and nursing note reviewed.  Constitutional:      General: She is not in acute distress.    Appearance: She is well-developed. She is obese.  HENT:     Head: Normocephalic and atraumatic.     Mouth/Throat:     Mouth: Mucous membranes are moist.  Eyes:     Extraocular Movements: Extraocular movements intact.  Cardiovascular:     Rate and Rhythm: Normal rate and regular rhythm.     Heart sounds: Normal heart sounds.     Comments: Mild diffuse chest wall tenderness to palpation Pulmonary:     Effort: Pulmonary effort is normal.     Breath sounds: Normal breath sounds.  Abdominal:     Palpations: Abdomen is soft.     Tenderness: There is no abdominal tenderness.  Musculoskeletal:        General: Normal range of motion.     Cervical back: Normal range of motion and neck supple. No rigidity.  Skin:    General: Skin is warm and dry.  Neurological:     Mental Status: She is alert and oriented to person, place, and time.  GCS: GCS eye subscore is 4. GCS verbal subscore is 5. GCS motor subscore is 6.     Cranial Nerves: No cranial nerve deficit, dysarthria or facial asymmetry.     Sensory: No sensory deficit.     Motor: No weakness.  Psychiatric:        Mood and Affect: Mood normal.        Speech: Speech normal.        Behavior: Behavior normal.     ED Results / Procedures / Treatments   Labs (all labs ordered are listed, but only abnormal results are displayed) Labs Reviewed  BASIC METABOLIC PANEL - Abnormal; Notable for the following components:      Result Value   Calcium 8.7 (*)    All other components within normal limits  SARS CORONAVIRUS 2 BY RT PCR  CBC  PREGNANCY, URINE  TROPONIN I (HIGH SENSITIVITY)    EKG EKG Interpretation Date/Time:  Monday September 27 2022 09:55:14 EDT Ventricular Rate:  73 PR Interval:  160 QRS Duration:  94 QT Interval:  357 QTC Calculation: 394 R Axis:   28  Text Interpretation: Sinus rhythm Interpretation limited secondary to artifact Otherwise no significant change Confirmed by Elayne Snare ([id number]) on 09/27/2022 10:51:28 AM  Radiology DG Chest 2 View  Result Date: 09/27/2022 CLINICAL DATA:  Chest pain and shortness of breath EXAM: CHEST - 2 VIEW COMPARISON:  Chest radiograph dated 06/05/2022 FINDINGS: Normal lung volumes. No focal consolidations. No pleural effusion or pneumothorax. The heart size and mediastinal contours are within normal limits. No acute osseous abnormality. IMPRESSION: No active cardiopulmonary disease. Electronically Signed   By: Agustin Cree M.D.   On: 09/27/2022 10:36    Procedures Procedures    Medications Ordered in ED Medications  ketorolac (TORADOL) 15 MG/ML injection 15 mg (15 mg Intravenous Given 09/27/22 1047)  metoCLOPramide (REGLAN) injection 10 mg (10 mg Intravenous Given 09/27/22 1048)  acetaminophen (TYLENOL) tablet 1,000 mg (1,000 mg Oral Given 09/27/22 1048)    ED Course/ Medical Decision Making/ A&P                                 Medical Decision Making This patient presents to the ED with chief complaint(s) of headache, chest pain with no pertinent past medical history which further complicates the presenting complaint. The complaint involves an extensive differential diagnosis and also carries with it a high risk of complications and morbidity.    The differential diagnosis includes his headache was not sudden onset without associated neurologic deficits making ICH or mass effect unlikely, no fevers, neck pain or meningismus making meningitis unlikely, considering likely tension headache, migraine headache, she is hypertensive concerning for possible preeclamptic headache, considering ACS, arrhythmia, anemia, pneumonia, pneumothorax, pulmonary edema, pleural effusion,  gastritis, GERD  Additional history obtained: Additional history obtained from N/A Records reviewed Care Everywhere/External Records  ED Course and Reassessment: On patient's arrival she was mildly hypertensive otherwise hemodynamically stable in no acute distress.  EKG on arrival showed normal sinus rhythm without acute ischemic changes.  She had pregnancy test performed by triage that was negative making preeclampsia unlikely.  Patient will have labs including troponin and chest x-ray performed.  She will be treated with a headache cocktail and will be closely reassessed.  11:07 AM Patient's nurse alerted me that the patient would like to leave.  Patient reports feeling jittery after receiving Reglan.  She was offered Benadryl  for symptomatic management and was informed that we are still pending her troponin however the patient has decided to leave against medical advice. The patient had a normal mental status examination and understands her condition and the risks of leaving including ACS/MI/myocarditis and death. The patient has had an opportunity to ask questions about her medical condition. The patient has been informed that she may return for care at any time and has been referred to her physician.  Patient was given verbal instructions and did not want to wait for her paperwork.  Independent labs interpretation:  The following labs were independently interpreted: CBC and BMP within normal range, troponin pending  Independent visualization of imaging: - I independently visualized the following imaging with scope of interpretation limited to determining acute life threatening conditions related to emergency care: Chest x-ray, which revealed no acute disease  Consultation: - Consulted or discussed management/test interpretation w/ external professional: N/A  Consideration for admission or further workup: Recommended for further workup with troponins however chose to leave AGAINST MEDICAL  ADVICE Social Determinants of health: N/A    Amount and/or Complexity of Data Reviewed Labs: ordered. Radiology: ordered.  Risk OTC drugs. Prescription drug management.          Final Clinical Impression(s) / ED Diagnoses Final diagnoses:  Acute nonintractable headache, unspecified headache type  Nonspecific chest pain  Left against medical advice    Rx / DC Orders ED Discharge Orders     None         Rexford Maus, DO 09/27/22 1109

## 2022-09-27 NOTE — ED Notes (Signed)
Urine specimen in lab 

## 2023-04-11 ENCOUNTER — Other Ambulatory Visit: Payer: Self-pay

## 2023-04-11 ENCOUNTER — Emergency Department (HOSPITAL_BASED_OUTPATIENT_CLINIC_OR_DEPARTMENT_OTHER)

## 2023-04-11 ENCOUNTER — Emergency Department (HOSPITAL_BASED_OUTPATIENT_CLINIC_OR_DEPARTMENT_OTHER)
Admission: EM | Admit: 2023-04-11 | Discharge: 2023-04-11 | Disposition: A | Discharge status: Home / Self Care | Attending: Emergency Medicine | Admitting: Emergency Medicine

## 2023-04-11 ENCOUNTER — Encounter (HOSPITAL_BASED_OUTPATIENT_CLINIC_OR_DEPARTMENT_OTHER): Payer: Self-pay

## 2023-04-11 DIAGNOSIS — S93401A Sprain of unspecified ligament of right ankle, initial encounter: Secondary | ICD-10-CM | POA: Insufficient documentation

## 2023-04-11 DIAGNOSIS — X501XXA Overexertion from prolonged static or awkward postures, initial encounter: Secondary | ICD-10-CM | POA: Diagnosis not present

## 2023-04-11 DIAGNOSIS — M25571 Pain in right ankle and joints of right foot: Secondary | ICD-10-CM | POA: Diagnosis present

## 2023-04-11 DIAGNOSIS — W1839XA Other fall on same level, initial encounter: Secondary | ICD-10-CM | POA: Diagnosis not present

## 2023-04-11 MED ORDER — NAPROXEN 375 MG PO TABS: 375.0000 mg | ORAL_TABLET | Freq: Two times a day (BID) | ORAL | 0 refills | Status: AC

## 2023-04-11 MED ORDER — ACETAMINOPHEN 325 MG PO TABS
650.0000 mg | ORAL_TABLET | Freq: Once | ORAL | Status: AC
  Administered 2023-04-11: 650 mg via ORAL
  Filled 2023-04-11: qty 2

## 2023-04-11 NOTE — ED Notes (Signed)
 Patient transported to X-ray

## 2023-04-11 NOTE — ED Provider Notes (Signed)
 [place] [facility] Provider Note   CSN: [id number] Arrival date & time: 04/11/23  1025     History  Chief Complaint  Patient presents with   Ankle Pain    Joan Espinoza is a 28 y.o. female.   Ankle Pain    Patient presents ED with complaints of ankle pain.  Patient states she was walking last night when she tripped and fell and rolled her ankle.  She has experienced pain and swelling primarily in her ankle but she also feels pain in her foot and pain in her entire leg below her right knee.  Pain increases whenever she tries to stand  Home Medications Prior to Admission medications   Medication Sig Start Date End Date Taking? Authorizing Provider  naproxen (NAPROSYN) 375 MG tablet Take 1 tablet (375 mg total) by mouth 2 (two) times daily. 04/11/23  Yes Linwood Dibbles, MD  amoxicillin-clavulanate (AUGMENTIN) 875-125 MG tablet Take 1 tablet by mouth every 12 (twelve) hours. 06/05/22   Curatolo, Adam, DO  HYDROcodone-acetaminophen (NORCO/VICODIN) 5-325 MG tablet Take 1 tablet by mouth every 4 (four) hours as needed. 05/29/22   Jacalyn Lefevre, MD  ibuprofen (ADVIL) 600 MG tablet Take 1 tablet (600 mg total) by mouth every 6 (six) hours as needed. 05/29/22   Jacalyn Lefevre, MD  NEXPLANON 68 MG IMPL implant 68 mg by Implant route continuous. 09/03/15   [provider]      Allergies    Patient has no known allergies.    Review of Systems   Review of Systems  Physical Exam Updated Vital Signs BP 131/86   Pulse 84   Temp 98.1 F (36.7 C)   Resp 20   Ht 1.651 m (5\' 5" )   Wt (!) 158.2 kg   LMP 03/15/2023 (Exact Date)   SpO2 98%   BMI 58.04 kg/m  Physical Exam Vitals and nursing note reviewed.  Constitutional:      General: She is not in acute distress.    Appearance: She is well-developed.  HENT:     Head: Normocephalic and atraumatic.     Right Ear: External ear normal.     Left Ear: External ear normal.  Eyes:     General: No  scleral icterus.       Right eye: No discharge.        Left eye: No discharge.     Conjunctiva/sclera: Conjunctivae normal.  Neck:     Trachea: No tracheal deviation.  Cardiovascular:     Rate and Rhythm: Normal rate.  Pulmonary:     Effort: Pulmonary effort is normal. No respiratory distress.     Breath sounds: No stridor.  Abdominal:     General: There is no distension.  Musculoskeletal:        General: Swelling present. No deformity.     Cervical back: Neck supple.     Right knee: No swelling.     Right lower leg: No swelling. No edema.     Right ankle: Swelling present. Tenderness present.     Right foot: Tenderness present. No swelling.  Skin:    General: Skin is warm and dry.     Findings: No rash.  Neurological:     Mental Status: She is alert. Mental status is at baseline.     Cranial Nerves: No dysarthria or facial asymmetry.     Motor: No seizure activity.     ED Results / Procedures / Treatments   Labs (all  labs ordered are listed, but only abnormal results are displayed) Labs Reviewed - No data to display  EKG None  Radiology DG Tibia/Fibula Right Result Date: 04/11/2023 CLINICAL DATA:  Fall.  Proximal fibula pain. EXAM: RIGHT TIBIA AND FIBULA - 2 VIEW COMPARISON:  None Available. FINDINGS: No evidence for an acute fracture involving the tibia or fibula. No worrisome lytic or sclerotic osseous abnormality. IMPRESSION: Negative. Electronically Signed   By: Kennith Center M.D.   On: 04/11/2023 12:00   DG Foot Complete Right Result Date: 04/11/2023 CLINICAL DATA:  Fall with foot pain. EXAM: RIGHT FOOT COMPLETE - 3+ VIEW COMPARISON:  None Available. FINDINGS: There is no evidence of fracture or dislocation. There is no evidence of arthropathy or other focal bone abnormality. Soft tissues are unremarkable. IMPRESSION: Negative. Electronically Signed   By: Kennith Center M.D.   On: 04/11/2023 11:59   DG Ankle Complete Right Result Date: 04/11/2023 CLINICAL DATA:   Right ankle injury with pain. EXAM: RIGHT ANKLE - COMPLETE 3+ VIEW COMPARISON:  None. FINDINGS: No evidence for an acute fracture. Subtle tiny cortical fragments adjacent to the lateral talus are probably related to chronic cortical avulsion injury. A degree of mild widening of the lateral tibial talar joint space cannot be excluded. No worrisome lytic or sclerotic osseous abnormality. IMPRESSION: Question trace widening of the lateral tibiotalar joint space. Otherwise no evidence for an acute fracture or dislocation. Electronically Signed   By: Kennith Center M.D.   On: 04/11/2023 11:58    Procedures Procedures    Medications Ordered in ED Medications  acetaminophen (TYLENOL) tablet 650 mg (650 mg Oral Given 04/11/23 1101)    ED Course/ Medical Decision Making/ A&P Clinical Course as of 04/11/23 1254  Mon Apr 11, 2023  1225 X-ray shows trace widening of the lateral tibiotalar space but no evidence of fracture or dislocation [JK]    Clinical Course User Index [JK] Linwood Dibbles, MD                                 Medical Decision Making Problems Addressed: Sprain of right ankle, unspecified ligament, initial encounter: acute illness or injury that poses a threat to life or bodily functions  Amount and/or Complexity of Data Reviewed Radiology: ordered and independent interpretation performed.  Risk OTC drugs. Prescription drug management.   Patient presented to the ER for evaluation of an ankle injury.  She had tenderness primarily in her ankle but also involves her foot and proximal fibula.  X-rays fortunately do not show any signs of fracture or dislocation.  There is some questionable widening of the tibial talar space.  Will treat the patient for an ankle sprain.  Splint and crutches.  Ice and NSAIDs.  Outpatient follow-up with orthopedics.        Final Clinical Impression(s) / ED Diagnoses Final diagnoses:  Sprain of right ankle, unspecified ligament, initial encounter     Rx / DC Orders ED Discharge Orders          Ordered    naproxen (NAPROSYN) 375 MG tablet  2 times daily        04/11/23 1253              Linwood Dibbles, MD 04/11/23 1255

## 2023-04-11 NOTE — ED Notes (Signed)
Ice pack for ankle  

## 2023-04-11 NOTE — ED Triage Notes (Signed)
 Pt reports that she fell last night while walking and rolled her right ankle. Pt states that it is hard to bare weight. States that th right ankle is painful and swelling. States that she feels like the bone is disconnected.

## 2023-04-11 NOTE — Discharge Instructions (Signed)
 Use the crutches and splint for support.  Apply ice to help with the swelling.  Take the medications to help with pain.  Follow-up with an orthopedic doctor for further evaluation of your ankle injury.

## 2023-04-18 ENCOUNTER — Encounter (HOSPITAL_BASED_OUTPATIENT_CLINIC_OR_DEPARTMENT_OTHER): Payer: Self-pay | Admitting: Emergency Medicine

## 2023-04-18 ENCOUNTER — Emergency Department (HOSPITAL_BASED_OUTPATIENT_CLINIC_OR_DEPARTMENT_OTHER)

## 2023-04-18 ENCOUNTER — Emergency Department (HOSPITAL_BASED_OUTPATIENT_CLINIC_OR_DEPARTMENT_OTHER)
Admission: EM | Admit: 2023-04-18 | Discharge: 2023-04-18 | Disposition: A | Discharge status: Home / Self Care | Attending: Emergency Medicine | Admitting: Emergency Medicine

## 2023-04-18 ENCOUNTER — Other Ambulatory Visit: Payer: Self-pay

## 2023-04-18 DIAGNOSIS — J069 Acute upper respiratory infection, unspecified: Secondary | ICD-10-CM | POA: Diagnosis not present

## 2023-04-18 DIAGNOSIS — R059 Cough, unspecified: Secondary | ICD-10-CM | POA: Diagnosis present

## 2023-04-18 LAB — RESP PANEL BY RT-PCR (RSV, FLU A&B, COVID)  RVPGX2
Influenza A by PCR: NEGATIVE
Influenza B by PCR: NEGATIVE
Resp Syncytial Virus by PCR: NEGATIVE
SARS Coronavirus 2 by RT PCR: NEGATIVE

## 2023-04-18 MED ORDER — ALBUTEROL SULFATE HFA 108 (90 BASE) MCG/ACT IN AERS: 1.0000 | INHALATION_SPRAY | Freq: Four times a day (QID) | RESPIRATORY_TRACT | 0 refills | Status: AC | PRN

## 2023-04-18 MED ORDER — PREDNISONE 20 MG PO TABS: 40.0000 mg | ORAL_TABLET | Freq: Every day | ORAL | 0 refills | Status: AC

## 2023-04-18 MED ORDER — IPRATROPIUM-ALBUTEROL 0.5-2.5 (3) MG/3ML IN SOLN
3.0000 mL | Freq: Once | RESPIRATORY_TRACT | Status: AC
  Administered 2023-04-18: 3 mL via RESPIRATORY_TRACT
  Filled 2023-04-18: qty 3

## 2023-04-18 NOTE — ED Provider Notes (Signed)
 [place] [facility] Provider Note   CSN: [id number] Arrival date & time: 04/18/23  1742     History  Chief Complaint  Patient presents with   Nasal Congestion    Joan Espinoza is a 28 y.o. female.  This is a 28 year old female who is here today for 3 days of nasal congestion, cough, rhinorrhea, shortness of breath.        Home Medications Prior to Admission medications   Medication Sig Start Date End Date Taking? Authorizing Provider  albuterol (VENTOLIN HFA) 108 (90 Base) MCG/ACT inhaler Inhale 1-2 puffs into the lungs every 6 (six) hours as needed for wheezing or shortness of breath. 04/18/23  Yes Anders Simmonds T, DO  predniSONE (DELTASONE) 20 MG tablet Take 2 tablets (40 mg total) by mouth daily for 5 days. 04/18/23 04/23/23 Yes Anders Simmonds T, DO  amoxicillin-clavulanate (AUGMENTIN) 875-125 MG tablet Take 1 tablet by mouth every 12 (twelve) hours. 06/05/22   Curatolo, Adam, DO  HYDROcodone-acetaminophen (NORCO/VICODIN) 5-325 MG tablet Take 1 tablet by mouth every 4 (four) hours as needed. 05/29/22   Jacalyn Lefevre, MD  ibuprofen (ADVIL) 600 MG tablet Take 1 tablet (600 mg total) by mouth every 6 (six) hours as needed. 05/29/22   Jacalyn Lefevre, MD  naproxen (NAPROSYN) 375 MG tablet Take 1 tablet (375 mg total) by mouth 2 (two) times daily. 04/11/23   Linwood Dibbles, MD  NEXPLANON 68 MG IMPL implant 68 mg by Implant route continuous. 09/03/15   [provider]      Allergies    Patient has no known allergies.    Review of Systems   Review of Systems  Physical Exam Updated Vital Signs BP (!) 149/89   Pulse (!) 103   Temp 98.1 F (36.7 C) (Oral)   Resp 20   LMP 03/15/2023 (Exact Date)   SpO2 94%  Physical Exam Vitals reviewed.  Constitutional:      Appearance: She is not toxic-appearing.  HENT:     Mouth/Throat:     Mouth: Mucous membranes are moist.     Pharynx: Posterior oropharyngeal erythema present. No oropharyngeal  exudate.  Eyes:     Pupils: Pupils are equal, round, and reactive to light.  Cardiovascular:     Rate and Rhythm: Tachycardia present.  Pulmonary:     Effort: Pulmonary effort is normal.     Breath sounds: Wheezing present.  Musculoskeletal:     Cervical back: Normal range of motion.  Neurological:     General: No focal deficit present.     Mental Status: She is alert.     ED Results / Procedures / Treatments   Labs (all labs ordered are listed, but only abnormal results are displayed) Labs Reviewed  RESP PANEL BY RT-PCR (RSV, FLU A&B, COVID)  [id number]    EKG None  Radiology No results found.  Procedures Procedures    Medications Ordered in ED Medications  ipratropium-albuterol (DUONEB) 0.5-2.5 (3) MG/3ML nebulizer solution 3 mL (3 mLs Nebulization Given 04/18/23 1937)    ED Course/ Medical Decision Making/ A&P                                 Medical Decision Making This is a 28 year old female who is here today for runny nose, cough, congestion, shortness of breath.  Differential diagnose include viral syndrome, reactive airway disease, pneumonia, considered pulmonary embolism.  Plan-with the patient constellation  of symptoms, believe that pulmonary embolism is less likely.  On exam, patient did have bilateral wheezing.  Says that she does not have a history of asthma personally, however she does have a strong family history of asthma.  Unaware if the patient is experiencing bit of reactive airway disease, secondary to her URI symptoms.  Will provide the patient with a DuoNeb, obtain a chest x-ray and reassess.  Reassessment 8:30 PM-patient sounds a bit better after a DuoNeb.  Will discharge her with a few days of steroids, with an inhaler.  Counseled patient on return precautions at bedside.  Will discharge.  My independent review of the patient's chest xray shows no pneumonia.  Amount and/or Complexity of Data Reviewed Radiology: ordered.  Risk Prescription drug  management.           Final Clinical Impression(s) / ED Diagnoses Final diagnoses:  Viral upper respiratory tract infection    Rx / DC Orders ED Discharge Orders          Ordered    predniSONE (DELTASONE) 20 MG tablet  Daily        04/18/23 2034    albuterol (VENTOLIN HFA) 108 (90 Base) MCG/ACT inhaler  Every 6 hours PRN        04/18/23 2034              Anders Simmonds T, DO 04/18/23 2035

## 2023-04-18 NOTE — Discharge Instructions (Addendum)
 You can take an over-the-counter antihistamine like Claritin or Zyrtec.  Beginning tomorrow, you can take 40 mg of prednisone each day for the next 5 days.  I would also recommend using albuterol inhaler as needed over the next few days.  Return to the emergency room if you experience increased difficulty with your breathing, lightheadedness, or lose consciousness.  Your symptoms should begin to improve over the next 2 to 3 days.

## 2023-04-18 NOTE — ED Triage Notes (Signed)
 Nasal congestion started 3 days go , shortness of breath today . Body aches  chills .

## 2023-06-14 ENCOUNTER — Emergency Department (HOSPITAL_BASED_OUTPATIENT_CLINIC_OR_DEPARTMENT_OTHER)

## 2023-06-14 ENCOUNTER — Emergency Department (HOSPITAL_BASED_OUTPATIENT_CLINIC_OR_DEPARTMENT_OTHER)
Admission: EM | Admit: 2023-06-14 | Discharge: 2023-06-14 | Disposition: A | Discharge status: Home / Self Care | Attending: Emergency Medicine | Admitting: Emergency Medicine

## 2023-06-14 ENCOUNTER — Encounter (HOSPITAL_BASED_OUTPATIENT_CLINIC_OR_DEPARTMENT_OTHER): Payer: Self-pay | Admitting: Urology

## 2023-06-14 ENCOUNTER — Emergency Department (HOSPITAL_COMMUNITY)

## 2023-06-14 DIAGNOSIS — R072 Precordial pain: Secondary | ICD-10-CM | POA: Insufficient documentation

## 2023-06-14 DIAGNOSIS — R0789 Other chest pain: Secondary | ICD-10-CM | POA: Insufficient documentation

## 2023-06-14 DIAGNOSIS — R002 Palpitations: Secondary | ICD-10-CM | POA: Diagnosis not present

## 2023-06-14 DIAGNOSIS — R1030 Lower abdominal pain, unspecified: Secondary | ICD-10-CM | POA: Insufficient documentation

## 2023-06-14 LAB — COMPREHENSIVE METABOLIC PANEL WITH GFR
ALT: 11 U/L (ref 0–44)
AST: 17 U/L (ref 15–41)
Albumin: 4.2 g/dL (ref 3.5–5.0)
Alkaline Phosphatase: 36 U/L — ABNORMAL LOW (ref 38–126)
Anion gap: 11 (ref 5–15)
BUN: 9 mg/dL (ref 6–20)
CO2: 22 mmol/L (ref 22–32)
Calcium: 9.1 mg/dL (ref 8.9–10.3)
Chloride: 104 mmol/L (ref 98–111)
Creatinine, Ser: 0.62 mg/dL (ref 0.44–1.00)
GFR, Estimated: >60 mL/min (ref >60)
Glucose, Bld: 85 mg/dL (ref 70–99)
Potassium: 4.4 mmol/L (ref 3.5–5.1)
Sodium: 137 mmol/L (ref 135–145)
Total Bilirubin: 0.3 mg/dL (ref 0.0–1.2)
Total Protein: 7.7 g/dL (ref 6.5–8.1)

## 2023-06-14 LAB — URINALYSIS, ROUTINE W REFLEX MICROSCOPIC
Bilirubin Urine: NEGATIVE
Glucose, UA: NEGATIVE mg/dL
Hgb urine dipstick: NEGATIVE
Ketones, ur: 40 mg/dL — AB
Leukocytes,Ua: NEGATIVE
Nitrite: NEGATIVE
Protein, ur: NEGATIVE mg/dL
Specific Gravity, Urine: >=1.03 (ref 1.005–1.030)
pH: 5.5 (ref 5.0–8.0)

## 2023-06-14 LAB — PREGNANCY, URINE: Preg Test, Ur: NEGATIVE

## 2023-06-14 LAB — LIPASE, BLOOD: Lipase: 34 U/L (ref 11–51)

## 2023-06-14 LAB — TROPONIN T, HIGH SENSITIVITY: Troponin T High Sensitivity: <15 ng/L (ref <19)

## 2023-06-14 LAB — CBC
HCT: 42 % (ref 36.0–46.0)
Hemoglobin: 13.5 g/dL (ref 12.0–15.0)
MCH: 29.3 pg (ref 26.0–34.0)
MCHC: 32.1 g/dL (ref 30.0–36.0)
MCV: 91.1 fL (ref 80.0–100.0)
Platelets: 281 10*3/uL (ref 150–400)
RBC: 4.61 MIL/uL (ref 3.87–5.11)
RDW: 14.1 % (ref 11.5–15.5)
WBC: 5.9 10*3/uL (ref 4.0–10.5)
nRBC: 0 % (ref 0.0–0.2)

## 2023-06-14 MED ORDER — METOCLOPRAMIDE HCL 10 MG PO TABS: 10.0000 mg | ORAL_TABLET | Freq: Four times a day (QID) | ORAL | 0 refills | Status: AC

## 2023-06-14 MED ORDER — KETOROLAC TROMETHAMINE 15 MG/ML IJ SOLN
15.0000 mg | Freq: Once | INTRAMUSCULAR | Status: AC
  Administered 2023-06-14: 15 mg via INTRAVENOUS
  Filled 2023-06-14: qty 1

## 2023-06-14 MED ORDER — METOCLOPRAMIDE HCL 5 MG/ML IJ SOLN
5.0000 mg | Freq: Once | INTRAMUSCULAR | Status: AC
  Administered 2023-06-14: 5 mg via INTRAVENOUS
  Filled 2023-06-14: qty 2

## 2023-06-14 MED ORDER — IOHEXOL 350 MG/ML SOLN
100.0000 mL | Freq: Once | INTRAVENOUS | Status: AC | PRN
  Administered 2023-06-14: 100 mL via INTRAVENOUS

## 2023-06-14 MED ORDER — NAPROXEN 500 MG PO TABS: 500.0000 mg | ORAL_TABLET | Freq: Two times a day (BID) | ORAL | 0 refills | Status: AC

## 2023-06-14 NOTE — ED Notes (Signed)
 D/c paperwork reviewed with pt, including prescriptions and follow up care.  All questions and/or concerns addressed at time of d/c.  No further needs expressed. . Pt verbalized understanding, Ambulatory with significant other to ED exit, NAD.

## 2023-06-14 NOTE — ED Notes (Signed)
 Pt reports she has not taken BP meds in at least a month.

## 2023-06-14 NOTE — ED Triage Notes (Signed)
 Pt states pelvic pain x 2 day  Had watery BM this am  Pt taking wygovy x 3 month and increased dose 3 weeks ago    Pt also states chest pain that started this am  Denies N/V  States slight SOB as well

## 2023-06-14 NOTE — Discharge Instructions (Signed)
 As discussed, your labs and imaging are reassuring.  No signs of heart attack or pulmonary embolism causing your chest pain.  There is no acute abdominal pain detected, pain likely due to your Wegovy injections.  Take naproxen  twice a day for the next week to help with your chest discomfort and reglan  every 6 hours as needed for nausea and vomiting.  Follow-up with your PCP in the next 5 days for reevaluation of your symptoms.  Get help right away if: You have nausea or vomiting. You feel sweaty or light-headed. You have a cough with mucus from your lungs (sputum) or you cough up blood. You develop shortness of breath.

## 2023-06-14 NOTE — ED Provider Notes (Signed)
  EM[facility]Y [facility]MENT AT [facility] Provider Note   CSN: [id number] Arrival date & time: 06/14/23  1312     History  Chief Complaint  Patient presents with   Chest Pain   Abdominal Pain    Joan Espinoza is a 28 y.o. female with a history of morbid obesity, migraines, and ADHD who presents the ED today for chest pain and abdominal pain.  Patient reports waking up with chest pain and palpitations this morning.  Reports the pain is at the left lower sternum that has been persistent since onset.  Denies experiencing any similar pain before.  States that when she was walking back to the room the pain in her chest stated she was experiencing shortness of breath as well.  At rest, pain is worse to touch and with movement.  Pain is not worse with deep inspiration.  Additionally, patient endorses lower abdominal pain.  States that she is on [facility] for weight loss and gets abdominal pain after using her injections.  Last injection was about 3 days ago she has been having persistent lower abdominal pain since then.  States that does not usually last this long.  Not been taking anything for pain at home.  Has been using Phenergan and Zofran  without much improvement of nausea.  Denies vomiting or diarrhea.  No fevers.  No additional complaints or concerns at this time.    Home Medications Prior to Admission medications   Medication Sig Start Date End Date Taking? Authorizing Provider  metoCLOPramide  (REGLAN ) 10 MG tablet Take 1 tablet (10 mg total) by mouth every 6 (six) hours. 06/14/23 07/14/23 Yes Sonnie Dusky, PA-C  naproxen  (NAPROSYN ) 500 MG tablet Take 1 tablet (500 mg total) by mouth 2 (two) times daily for 7 days. 06/14/23 06/21/23 Yes Sonnie Dusky, PA-C  albuterol  (VENTOLIN  HFA) 108 (90 Base) MCG/ACT inhaler Inhale 1-2 puffs into the lungs every 6 (six) hours as needed for wheezing or shortness of breath. 04/18/23   Nathanael Baker, DO  amoxicillin -clavulanate (AUGMENTIN ) 875-125  MG tablet Take 1 tablet by mouth every 12 (twelve) hours. 06/05/22   Curatolo, Adam, DO  HYDROcodone -acetaminophen  (NORCO/VICODIN) 5-325 MG tablet Take 1 tablet by mouth every 4 (four) hours as needed. 05/29/22   Sueellen Emery, MD  ibuprofen  (ADVIL ) 600 MG tablet Take 1 tablet (600 mg total) by mouth every 6 (six) hours as needed. 05/29/22   Sueellen Emery, MD  naproxen  (NAPROSYN ) 375 MG tablet Take 1 tablet (375 mg total) by mouth 2 (two) times daily. 04/11/23   Trish Furl, MD  NEXPLANON 68 MG IMPL implant 68 mg by Implant route continuous. 09/03/15   [provider]      Allergies    Patient has no known allergies.    Review of Systems   Review of Systems  Cardiovascular:  Positive for chest pain.  Gastrointestinal:  Positive for abdominal pain.  All other systems reviewed and are negative.   Physical Exam Updated Vital Signs BP 124/70   Pulse 82   Temp 97.8 F (36.6 C)   Resp 19   Ht 5\' 5"  (1.651 m)   Wt (!) 144.7 kg   LMP 05/14/2023   SpO2 100%   BMI 53.08 kg/m  Physical Exam Vitals and nursing note reviewed.  Constitutional:      General: She is not in acute distress.    Appearance: Normal appearance.  HENT:     Head: Normocephalic and atraumatic.     Mouth/Throat:  Mouth: Mucous membranes are moist.  Eyes:     Conjunctiva/sclera: Conjunctivae normal.     Pupils: Pupils are equal, round, and reactive to light.  Cardiovascular:     Rate and Rhythm: Normal rate and regular rhythm.     Pulses: Normal pulses.     Heart sounds: Normal heart sounds.  Pulmonary:     Effort: Pulmonary effort is normal.     Breath sounds: Normal breath sounds.     Comments: TTP of left lower sternum and underneath the medial aspect of the breast.  Chest:     Chest wall: Tenderness present.  Abdominal:     Palpations: Abdomen is soft.     Tenderness: There is abdominal tenderness.     Comments: Tenderness to the lower abdomen, at the suprapubic region without guarding or  rebound.  No redness or swelling at the abdomen around injection site.  Musculoskeletal:        General: Normal range of motion.     Cervical back: Normal range of motion.  Skin:    General: Skin is warm and dry.     Findings: No erythema or rash.  Neurological:     General: No focal deficit present.     Mental Status: She is alert.  Psychiatric:        Mood and Affect: Mood normal.        Behavior: Behavior normal.     ED Results / Procedures / Treatments   Labs (all labs ordered are listed, but only abnormal results are displayed) Labs Reviewed  COMPREHENSIVE METABOLIC PANEL WITH GFR - Abnormal; Notable for the following components:      Result Value   Alkaline Phosphatase 36 (*)    All other components within normal limits  URINALYSIS, ROUTINE W REFLEX MICROSCOPIC - Abnormal; Notable for the following components:   APPearance HAZY (*)    Ketones, ur 40 (*)    All other components within normal limits  LIPASE, BLOOD  CBC  PREGNANCY, URINE  TROPONIN T, HIGH SENSITIVITY    EKG EKG Interpretation Date/Time:  Tuesday June 14 2023 13:36:53 EDT Ventricular Rate:  75 PR Interval:  158 QRS Duration:  84 QT Interval:  348 QTC Calculation: 388 R Axis:   37  Text Interpretation: Normal sinus rhythm Possible Anterior infarct , age undetermined Abnormal ECG When compared with ECG of 27-Sep-2022 09:55, PREVIOUS ECG IS PRESENT Confirmed by Rosealee Concha ([id number]) on 06/14/2023 3:52:23 PM  Radiology CT Angio Chest PE W and/or Wo Contrast Result Date: 06/14/2023 CLINICAL DATA:  Acute abdominal pain. Positive D-dimer.  Pelvic pain. EXAM: CT ANGIOGRAPHY CHEST CT ABDOMEN AND PELVIS WITH CONTRAST TECHNIQUE: Multidetector CT imaging of the chest was performed using the standard protocol during bolus administration of intravenous contrast. Multiplanar CT image reconstructions and MIPs were obtained to evaluate the vascular anatomy. Multidetector CT imaging of the abdomen and pelvis was performed  using the standard protocol during bolus administration of intravenous contrast. RADIATION DOSE REDUCTION: This exam was performed according to the departmental dose-optimization program which includes automated exposure control, adjustment of the mA and/or kV according to patient size and/or use of iterative reconstruction technique. CONTRAST:  100mL OMNIPAQUE IOHEXOL 350 MG/ML SOLN COMPARISON:  None Available. FINDINGS: CTA CHEST FINDINGS Cardiovascular: Satisfactory opacification of the pulmonary arteries to the segmental level. No evidence of pulmonary embolism. Normal heart size. No pericardial effusion. Mediastinum/Nodes: No enlarged mediastinal, hilar, or axillary lymph nodes. Thyroid gland, trachea, and esophagus demonstrate no significant findings. Lungs/Pleura:  Lungs are clear. No pleural effusion or pneumothorax. Musculoskeletal: No chest wall abnormality. No acute or significant osseous findings. Review of the MIP images confirms the above findings. CT ABDOMEN and PELVIS FINDINGS Hepatobiliary: No focal liver abnormality is seen. No gallstones, gallbladder wall thickening, or biliary dilatation. Pancreas: Unremarkable. No pancreatic ductal dilatation or surrounding inflammatory changes. Spleen: Normal in size without focal abnormality. Adrenals/Urinary Tract: Adrenal glands are unremarkable. Kidneys are normal, without renal calculi, focal lesion, or hydronephrosis. Bladder is unremarkable. Stomach/Bowel: Stomach is within normal limits. Appendix appears normal. No evidence of bowel wall thickening, distention, or inflammatory changes. Vascular/Lymphatic: No significant vascular findings are present. No enlarged abdominal or pelvic lymph nodes. Reproductive: Uterus and bilateral adnexa are unremarkable. Other: There is a small fat containing umbilical hernia. There is no ascites. Musculoskeletal: No fracture is seen. Review of the MIP images confirms the above findings. IMPRESSION: 1. No evidence for  pulmonary embolism. 2. No acute localizing process in the chest, abdomen or pelvis. 3. Small fat containing umbilical hernia. Electronically Signed   By: Tyron Gallon M.D.   On: 06/14/2023 18:34   CT ABDOMEN PELVIS W CONTRAST Result Date: 06/14/2023 CLINICAL DATA:  Acute abdominal pain. Positive D-dimer.  Pelvic pain. EXAM: CT ANGIOGRAPHY CHEST CT ABDOMEN AND PELVIS WITH CONTRAST TECHNIQUE: Multidetector CT imaging of the chest was performed using the standard protocol during bolus administration of intravenous contrast. Multiplanar CT image reconstructions and MIPs were obtained to evaluate the vascular anatomy. Multidetector CT imaging of the abdomen and pelvis was performed using the standard protocol during bolus administration of intravenous contrast. RADIATION DOSE REDUCTION: This exam was performed according to the departmental dose-optimization program which includes automated exposure control, adjustment of the mA and/or kV according to patient size and/or use of iterative reconstruction technique. CONTRAST:  100mL OMNIPAQUE IOHEXOL 350 MG/ML SOLN COMPARISON:  None Available. FINDINGS: CTA CHEST FINDINGS Cardiovascular: Satisfactory opacification of the pulmonary arteries to the segmental level. No evidence of pulmonary embolism. Normal heart size. No pericardial effusion. Mediastinum/Nodes: No enlarged mediastinal, hilar, or axillary lymph nodes. Thyroid gland, trachea, and esophagus demonstrate no significant findings. Lungs/Pleura: Lungs are clear. No pleural effusion or pneumothorax. Musculoskeletal: No chest wall abnormality. No acute or significant osseous findings. Review of the MIP images confirms the above findings. CT ABDOMEN and PELVIS FINDINGS Hepatobiliary: No focal liver abnormality is seen. No gallstones, gallbladder wall thickening, or biliary dilatation. Pancreas: Unremarkable. No pancreatic ductal dilatation or surrounding inflammatory changes. Spleen: Normal in size without focal  abnormality. Adrenals/Urinary Tract: Adrenal glands are unremarkable. Kidneys are normal, without renal calculi, focal lesion, or hydronephrosis. Bladder is unremarkable. Stomach/Bowel: Stomach is within normal limits. Appendix appears normal. No evidence of bowel wall thickening, distention, or inflammatory changes. Vascular/Lymphatic: No significant vascular findings are present. No enlarged abdominal or pelvic lymph nodes. Reproductive: Uterus and bilateral adnexa are unremarkable. Other: There is a small fat containing umbilical hernia. There is no ascites. Musculoskeletal: No fracture is seen. Review of the MIP images confirms the above findings. IMPRESSION: 1. No evidence for pulmonary embolism. 2. No acute localizing process in the chest, abdomen or pelvis. 3. Small fat containing umbilical hernia. Electronically Signed   By: Tyron Gallon M.D.   On: 06/14/2023 18:34   DG Chest 2 View Result Date: 06/14/2023 CLINICAL DATA:  cehst pain EXAM: CHEST - 2 VIEW COMPARISON:  04/18/2023 FINDINGS: Lungs clear Heart size and mediastinal contours are within normal limits. No effusion. Visualized bones unremarkable. IMPRESSION: No acute cardiopulmonary disease. Electronically Signed  By: Nicoletta Barrier M.D.   On: 06/14/2023 15:28    Procedures Procedures    Medications Ordered in ED Medications  ketorolac  (TORADOL ) 15 MG/ML injection 15 mg (15 mg Intravenous Given 06/14/23 1618)  metoCLOPramide  (REGLAN ) injection 5 mg (5 mg Intravenous Given 06/14/23 1616)  iohexol (OMNIPAQUE) 350 MG/ML injection 100 mL (100 mLs Intravenous Contrast Given 06/14/23 1706)    ED Course/ Medical Decision Making/ A&P                                 Medical Decision Making Amount and/or Complexity of Data Reviewed Labs: ordered. Radiology: ordered.  Risk Prescription drug management.   This patient presents to the ED for concern of chest pain and abdominal pain, this involves an extensive number of treatment options, and is  a complaint that carries with it a high risk of complications and morbidity.   Differential diagnosis includes: ACS, pulmonary embolism, costochondritis, pleurisy, pancreatitis, IBS, IBD, pain second to injections, gastroparesis, etc.   Comorbidities  See HPI above   Additional History  Additional history obtained from prior records   Cardiac Monitoring / EKG  The patient was maintained on a cardiac monitor.  I personally viewed and interpreted the cardiac monitored which showed: NSR with a heart rate of 75 bpm.   Lab Tests  I ordered and personally interpreted labs.  The pertinent results include:   Troponin < 15 CMP, lipase, and CBC are reassuring - no acute electrolyte abnormality, AKI, infection, or anemia Negative pregnancy test UA shows ketones otherwise reassuring   Imaging Studies  I ordered imaging studies including CXR, CTA PE study and CT abdomen/pelvis  I independently visualized and interpreted imaging which showed:  CXR shows no acute cardipulmonary disease CTA showed no signs of pulmonary embolism. CT abdomen pelvis showed no acute localizing process in abdomen or pelvis.  Small fat-containing umbilical hernia. I agree with the radiologist interpretation   Problem List / ED Course / Critical Interventions / Medication Management  Patient reports lower abdominal pain for the past 3 days, since giving herself her last Wegovy injection.  States that this pain is more persistent than it normally is.  There is no redness or warmth to touch around the injection site of the abdomen.  Low suspicion for cellulitis.  Pain likely secondary to injections, as she normally gets pain after them. She woke up this morning with left-sided chest pain near the mediastinum with associated palpitations.  States that pain is worse with ambulation and to touch.  Not worse with deep inspiration. With ambulation in the room, patient's heart rate increased to 110 from 78 bpm and  respirations increased to 41 breaths per minute from 14. Reported feeling "woozy" with this.  Will get PE study to make sure that there is no pulmonary embolism causing her pain. I ordered medications including: Toradol  for chest pain Reglan  for nausea Reevaluation of the patient after these medicines showed that the patient improved I have reviewed the patients home medicines and have made adjustments as needed   Social Determinants of Health  Housing   Test / Admission - Considered  Discussed findings with patient.  All questions answered. She is stable and safe for discharge home. Return precautions given.       Final Clinical Impression(s) / ED Diagnoses Final diagnoses:  Atypical chest pain  Lower abdominal pain    Rx / DC Orders ED Discharge Orders  Ordered    metoCLOPramide  (REGLAN ) 10 MG tablet  Every 6 hours        06/14/23 1844    naproxen  (NAPROSYN ) 500 MG tablet  2 times daily        06/14/23 1844              Sonnie Dusky, PA-C 06/14/23 1912    Rosealee Concha, MD 06/14/23 2332

## 2023-06-14 NOTE — ED Notes (Signed)
 Pt ambulatory from triage

## 2023-08-12 ENCOUNTER — Other Ambulatory Visit: Payer: Self-pay

## 2023-08-12 ENCOUNTER — Emergency Department (HOSPITAL_BASED_OUTPATIENT_CLINIC_OR_DEPARTMENT_OTHER)
Admission: EM | Admit: 2023-08-12 | Discharge: 2023-08-12 | Disposition: A | Discharge status: Home / Self Care | Attending: Emergency Medicine | Admitting: Emergency Medicine

## 2023-08-12 ENCOUNTER — Encounter (HOSPITAL_BASED_OUTPATIENT_CLINIC_OR_DEPARTMENT_OTHER): Payer: Self-pay

## 2023-08-12 DIAGNOSIS — K0889 Other specified disorders of teeth and supporting structures: Secondary | ICD-10-CM | POA: Diagnosis present

## 2023-08-12 MED ORDER — OXYCODONE HCL 5 MG PO TABS
5.0000 mg | ORAL_TABLET | Freq: Once | ORAL | Status: AC
  Administered 2023-08-12: 5 mg via ORAL
  Filled 2023-08-12: qty 1

## 2023-08-12 MED ORDER — CELECOXIB 200 MG PO CAPS: 200.0000 mg | ORAL_CAPSULE | Freq: Two times a day (BID) | ORAL | 0 refills | Status: AC

## 2023-08-12 MED ORDER — KETOROLAC TROMETHAMINE 15 MG/ML IJ SOLN
30.0000 mg | Freq: Once | INTRAMUSCULAR | Status: AC
  Administered 2023-08-12: 30 mg via INTRAMUSCULAR
  Filled 2023-08-12: qty 2

## 2023-08-12 NOTE — ED Provider Notes (Signed)
 ------------------------------------------------------------------------------- Attestation signed by Ila Gentry, MD at 08/15/2023  2:13 PM I performed a history and physical examination of this patient and discussed the management with the resident.  I agree with the history, physical, assessment, and plan of care, with the following exceptions: None.  Ccx: dental pain -------------------------------------------------------------------------------            Chief Complaint  Patient presents with  . Dental Pain       HPI PT is a 28 yo F with pmh of HTN and obesity presenting for dental pain.  Pt complains of left lower wisdom tooth pain. The pain began yesterday with no inciting incident and has progressively worsened. She presented to urgent care yesterday and was given toradol  and oxycodone  to no significant effect. Pt states that she is scheduled to have her wisdom teeth taken out on Tuesday. Pt has not noticed discharge or excessive swelling around the tooth. Pt is seeking pain control to make it to the Tuesday appointment.        Patient History Medical History[1] Surgical History[2] Family History[3] Social History[4]    Review of Systems Review of Systems  Constitutional:  Negative for fever.  HENT:  Positive for dental problem.   Respiratory:  Negative for chest tightness and shortness of breath.   Cardiovascular:  Negative for chest pain.  Gastrointestinal:  Negative for abdominal distention, abdominal pain, nausea and vomiting.  Genitourinary:  Negative for difficulty urinating.      Physical Exam ED Triage Vitals [08/12/23 2110]  Temp 99.1 F (37.3 C)  Heart Rate 79  Resp 16  BP (!) 156/110  MAP (mmHg) 124  SpO2 100 %  O2 Device None (Room air)  O2 Flow Rate (L/min)   Weight    Physical Exam Constitutional:      General: She is not in acute distress.    Appearance: She is obese.   Cardiovascular:     Rate and Rhythm: Normal rate and  regular rhythm.     Heart sounds: No murmur heard.    No friction rub. No gallop.  Pulmonary:     Effort: Pulmonary effort is normal.     Breath sounds: Normal breath sounds.  Abdominal:     Palpations: Abdomen is soft.   Musculoskeletal:     Cervical back: No rigidity.   Neurological:     Mental Status: She is alert.        CHA2DS2-VASc Score: N/A  Glasgow Coma Scale Score: 15                 Procedures                       ED Course & MDM   Medical Decision Making Patient presenting for pain from dental carry of the left lower wisdom tooth. Physical exam without evidence of complicated carrie or abscess formation. Patient scheduled for extraction in 4 days.  Provided patient Tylenol , Advil , oxycodone .  7 day course of amoxicillin  sent to pharmacy for coverage of infectious process. Encourage patient to use over-the-counter Tylenol  and Advil  at home for pain management, with expectation of full resolution upon coming surgery.  Problems Addressed: Tooth caries: complicated acute illness or injury  Risk OTC drugs. Prescription drug management.      ED Disposition:  Discharge Final diagnoses:  Tooth caries    ED Prescriptions     Medication Sig Dispense Start Date End Date Auth. Provider   amoxicillin  (AMOXIL ) 500 mg  capsule Take 1 capsule (500 mg total) by mouth 2 (two) times a day for 7 days. 14 capsule 08/12/2023 08/19/2023 Caleb Revell Orlando Quivers, MD             [1] Past Medical History: Diagnosis Date  . Seasonal allergies   [2] Past Surgical History: Procedure Laterality Date  . REDUCTION MAMMAPLASTY Bilateral    Procedure: REDUCTION MAMMAPLASTY  [3] Family History Problem Relation Name Age of Onset  . Hypertension Mother    . Glaucoma Father    . Pre-diabetes Maternal Grandmother    . Diabetes mellitus Paternal Grandmother         Diabetic coma  [4] Social History Tobacco Use  . Smoking status: Never  . Smokeless tobacco: Never   Substance Use Topics  . Alcohol use: Yes  . Drug use: Not Currently

## 2023-08-12 NOTE — ED Provider Notes (Signed)
 [facility] Provider Note   CSN: [id number] Arrival date & time: 08/12/23  0041     History Chief Complaint  Patient presents with   Dental Pain    HPI Joan Espinoza is a 28 y.o. female presenting for dental pain. Known wisdom teeth needing extraction Scheduled for 4 days from now No FCNVSSob  Patient's recorded medical, surgical, social, medication list and allergies were reviewed in the Snapshot window as part of the initial history.   Review of Systems   Review of Systems  Constitutional:  Negative for chills and fever.  HENT:  Positive for dental problem. Negative for ear pain and sore throat.   Eyes:  Negative for pain and visual disturbance.  Respiratory:  Negative for cough and shortness of breath.   Cardiovascular:  Negative for chest pain and palpitations.  Gastrointestinal:  Negative for abdominal pain and vomiting.  Genitourinary:  Negative for dysuria and hematuria.  Musculoskeletal:  Negative for arthralgias and back pain.  Skin:  Negative for color change and rash.  Neurological:  Negative for seizures and syncope.  All other systems reviewed and are negative.   Physical Exam Updated Vital Signs BP (!) 152/83 (BP Location: Left Arm)   Pulse 74   Temp 97.9 F (36.6 C)   Resp 18   Ht 5' 5 (1.651 m)   Wt (!) 136.5 kg   LMP 08/05/2023 (Exact Date)   SpO2 98%   BMI 50.09 kg/m  Physical Exam Vitals and nursing note reviewed.  Constitutional:      General: She is not in acute distress.    Appearance: She is well-developed.  HENT:     Head: Normocephalic and atraumatic.  Eyes:     Conjunctiva/sclera: Conjunctivae normal.  Cardiovascular:     Rate and Rhythm: Normal rate and regular rhythm.     Heart sounds: No murmur heard. Pulmonary:     Effort: Pulmonary effort is normal. No respiratory distress.     Breath sounds: Normal breath sounds.  Abdominal:     General: There is no distension.      Palpations: Abdomen is soft.     Tenderness: There is no abdominal tenderness. There is no right CVA tenderness or left CVA tenderness.  Musculoskeletal:        General: No swelling or tenderness. Normal range of motion.     Cervical back: Neck supple.  Skin:    General: Skin is warm and dry.  Neurological:     General: No focal deficit present.     Mental Status: She is alert and oriented to person, place, and time. Mental status is at baseline.     Cranial Nerves: No cranial nerve deficit.      ED Course/ Medical Decision Making/ A&P    Procedures Procedures   Medications Ordered in ED Medications  ketorolac  (TORADOL ) 15 MG/ML injection 30 mg (has no administration in time range)  oxyCODONE  (Oxy IR/ROXICODONE ) immediate release tablet 5 mg (has no administration in time range)   Medical Decision Making:   Joan Espinoza is a 28 y.o. female who presented to the ED today with dental pain detailed above.    Complete initial physical exam performed, notably the patient was HDS in no acute distress. No obvious intraoral lesions or swelling. Poor dentition diffusely    Reviewed and confirmed nursing documentation for past medical history, family history, social history.    Initial Assessment:   With the patient's presentation of dental  pain, most likely diagnosis is reversible vs irreversible pulpitis. Other diagnoses were considered including (but not limited to) ludwig's angina, osteitis, dental abscess, PTA, RPA. These are considered less likely due to history of present illness and physical exam findings.   This is most consistent with an acute complicated illness  Initial Plan:  Symptomatic management with NSAIDS/Tylenol  Structural in nature. Symptoms. Patient will need definitive management with dentistry. Provided low cost/local dental resource chart provided by social work.  Disposition:  I have considered need for hospitalization, however, considering all of the above, I  believe this patient is stable for discharge at this time.  Patient/family educated about specific return precautions for given chief complaint and symptoms.  Patient/family educated about follow-up with PCP and dentistry.    Patient/family expressed understanding of return precautions and need for follow-up. Patient spoken to regarding all imaging and laboratory results and appropriate follow up for these results. All education provided in verbal form with additional information in written form. Time was allowed for answering of patient questions. Patient discharged.       Clinical Impression:  1. Pain, dental      Discharge   Final Clinical Impression(s) / ED Diagnoses Final diagnoses:  Pain, dental    Rx / DC Orders ED Discharge Orders          Ordered    celecoxib (CELEBREX) 200 MG capsule  2 times daily        08/12/23 0144              Jerral Meth, MD 08/12/23 0144

## 2023-08-12 NOTE — ED Triage Notes (Signed)
 Pt states she is having left dental pain. States she has an appointment to get her wisdom teeth pulled next week but needs something to help with the pain tonight. States she took tylenol  but it hasn't helped any.  Denies any other s/s at time of triage.

## 2023-08-12 NOTE — ED Triage Notes (Signed)
 Pt arrived POV from home.  Pt was seen last night at [facility] for dental pain.  Pt states she was unsure of the medication they put in her bottom.  Pt states she is suppose to get her wisdom teeth pulled Tuesday but the pain is getting worse.  Pt ambulatory and A&O x4 on arrival.

## 2023-09-16 NOTE — Telephone Encounter (Signed)
 Copied from CRM #[id number]. Topic: Medication/Rx - Rx Clarification/Change  >> Sep 16, 2023  4:13 PM Cherise F wrote: Espinoza, Joan is calling for medication request (Obtain: Medication Name, Dosage, Quantity, Frequency, Quantity Requested (example: 30-day supply.)   Include all details related to the request(s) below:  The patient would like for the Appeal for semaglutide, weight loss, (WEGOVY) 2.4 mg/0.75 mL subcutaneous pen injector to be uploaded to the [facility] due to her work scheduled. The patient gets off at 5:30 PM and will not be able to make it to the office before we close. Patient would like to sign the appeal over the app.   Confirm and type the Best Contact Number below:  Patient/caller contact number:          [phone]   [] Home  [x] Mobile  [] Work  []  Other   [x]  Okay to leave a voicemail   Medication List:  Current Outpatient Medications:  .  semaglutide, weight loss, (WEGOVY) 2.4 mg/0.75 mL subcutaneous pen injector, Inject 0.75 mL (2.4 mg total) under the skin every 7 days., Disp: 3 mL, Rfl: 5     Medication Request/Refills: Pharmacy Information (if applicable)   []  Not Applicable       []  Pharmacy listed  Send Medication Request to:                                                 []  Pharmacy not listed (added to pharmacy list in Epic) Send Medication Request to:      Listed Pharmacies: [id number] - [place], [place] - [address] AT [address] FROM [address] - PHONE: [phone] - FAX: [phone]

## 2023-09-19 NOTE — Telephone Encounter (Signed)
 Appeal form sent through portal on 09/16/23.  Margo Broach, CMA

## 2023-09-21 NOTE — Telephone Encounter (Signed)
 Called pt to reschedule video visit. Pt stated that she needs talk to her provider for med adjustments but she does not have the time because of her work schedule. She asked if she could get phone visit instead to discuss the changes. Pt also stated that she signed and submitted an appeal form that she attached through mychart.

## 2023-09-21 NOTE — Progress Notes (Signed)
 Patient not seen. Patient's video visit scheduled at 2:40pm. Provider running behind, patient was notified by Preet at 2:40pm.  Patient messaged us  at 2:51pm advising that her 15 minute break was over and she had to leave the video call.   Not considered a no show.  Will reschedule.   Joan Espinoza  Wed 09/21/2023 3:04 PM

## 2023-09-21 NOTE — Telephone Encounter (Signed)
 I called pt and scheduled her for video visit for 09/28/23.

## 2023-10-06 NOTE — Telephone Encounter (Signed)
 The patient had questions about her insurance covering the medication after October. I let the patient know she would have to contact the insurance to find out if the will keep covering the medication til the expiration date of the approval or will they stop when they are no longer covering it.  Margo Broach, CMA

## 2023-10-06 NOTE — Telephone Encounter (Signed)
 Copied from CRM #[id number]. Topic: Clinical Concerns - Medical Question >> Oct 06, 2023 11:18 AM Borris J wrote: [name], [name] is calling for medication request (Obtain: Medication Name, Dosage, Quantity, Frequency, Quantity Requested (example: 30-day supply.)   Include all details related to the request(s) below:  Patient is calling to find the statues of her prescription for  semaglutide, weight loss, (WEGOVY) 2.4 mg/0.75 mL subcutaneous pen injector [[id number]] . She wants to discuss the appeal . The dates of coverage     Confirm and type the Best Contact Number below:  Patient/caller contact number:   [phone]          [] Home  [x] Mobile  [] Work [] Other   [x] Okay to leave a voicemail   Medication List:  Current Outpatient Medications:  .  semaglutide, weight loss, (WEGOVY) 2.4 mg/0.75 mL subcutaneous pen injector, Inject 0.75 mL (2.4 mg total) under the skin every 7 days., Disp: 3 mL, Rfl: 5     Medication Request/Refills: Pharmacy Information (if applicable)   [] Not Applicable       []  Pharmacy listed  Send Medication Request to:                                                 [] Pharmacy not listed (added to pharmacy list in Epic) Send Medication Request to:      Listed Pharmacies: [id number] - [place], [place] - [address] AT [address] - PHONE: [phone] - FAX: [phone]
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
## 2023-11-15 ENCOUNTER — Other Ambulatory Visit: Payer: Self-pay

## 2023-11-15 ENCOUNTER — Encounter (HOSPITAL_BASED_OUTPATIENT_CLINIC_OR_DEPARTMENT_OTHER): Payer: Self-pay | Admitting: Emergency Medicine

## 2023-11-15 ENCOUNTER — Emergency Department (HOSPITAL_BASED_OUTPATIENT_CLINIC_OR_DEPARTMENT_OTHER)
Admission: EM | Admit: 2023-11-15 | Discharge: 2023-11-15 | Disposition: A | Discharge status: Home / Self Care | Attending: Emergency Medicine | Admitting: Emergency Medicine

## 2023-11-15 DIAGNOSIS — T50903A Poisoning by unspecified drugs, medicaments and biological substances, assault, initial encounter: Secondary | ICD-10-CM

## 2023-11-15 DIAGNOSIS — T40713A Poisoning by cannabis, assault, initial encounter: Secondary | ICD-10-CM | POA: Diagnosis present

## 2023-11-15 LAB — URINE DRUG SCREEN
Amphetamines: NEGATIVE
Barbiturates: NEGATIVE
Benzodiazepines: NEGATIVE
Cocaine: NEGATIVE
Fentanyl: NEGATIVE
Methadone Scn, Ur: NEGATIVE
Opiates: NEGATIVE
Tetrahydrocannabinol: POSITIVE — AB
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
## 2023-11-15 NOTE — ED Notes (Signed)
 RN provided AVS using Teachback Method. Patient verbalizes understanding of Discharge Instructions. Opportunity for Questioning and Answers were provided by RN. Patient Discharged from ED ambulatory to home via self.

## 2023-11-15 NOTE — ED Notes (Signed)
Provider at the Bedside. 

## 2024-01-14 ENCOUNTER — Encounter (HOSPITAL_BASED_OUTPATIENT_CLINIC_OR_DEPARTMENT_OTHER): Payer: Self-pay

## 2024-01-14 ENCOUNTER — Other Ambulatory Visit: Payer: Self-pay

## 2024-01-14 ENCOUNTER — Emergency Department (HOSPITAL_BASED_OUTPATIENT_CLINIC_OR_DEPARTMENT_OTHER)
Admission: EM | Admit: 2024-01-14 | Discharge: 2024-01-15 | Disposition: A | Source: Home / Self Care | Attending: Emergency Medicine | Admitting: Emergency Medicine

## 2024-01-14 DIAGNOSIS — R21 Rash and other nonspecific skin eruption: Secondary | ICD-10-CM | POA: Diagnosis present

## 2024-01-14 DIAGNOSIS — T7803XA Anaphylactic reaction due to other fish, initial encounter: Secondary | ICD-10-CM | POA: Diagnosis not present

## 2024-01-14 DIAGNOSIS — T7840XA Allergy, unspecified, initial encounter: Secondary | ICD-10-CM

## 2024-01-14 NOTE — ED Triage Notes (Signed)
 Patient here POV from Home.  Endorses SOB, itching, generalized redness that began about an hour ago. Shortly after eating salmon. NKA.  NAD noted during triage. A&Ox4. GCS 15. Ambulatory.

## 2024-01-15 MED ORDER — FAMOTIDINE IN NACL 20-0.9 MG/50ML-% IV SOLN
20.0000 mg | Freq: Once | INTRAVENOUS | Status: AC
  Administered 2024-01-15: 20 mg via INTRAVENOUS
  Filled 2024-01-15: qty 50

## 2024-01-15 MED ORDER — FAMOTIDINE 20 MG PO TABS: 20.0000 mg | ORAL_TABLET | Freq: Two times a day (BID) | ORAL | 0 refills | Status: AC

## 2024-01-15 MED ORDER — SODIUM CHLORIDE 0.9 % IV BOLUS
1000.0000 mL | Freq: Once | INTRAVENOUS | Status: AC
  Administered 2024-01-15: 1000 mL via INTRAVENOUS

## 2024-01-15 MED ORDER — EPINEPHRINE 0.3 MG/0.3ML IJ SOAJ: 0.3000 mg | INTRAMUSCULAR | 1 refills | Status: AC | PRN

## 2024-01-15 MED ORDER — PREDNISONE 10 MG PO TABS: ORAL_TABLET | ORAL | 0 refills | Status: AC

## 2024-01-15 MED ORDER — DIPHENHYDRAMINE HCL 50 MG/ML IJ SOLN
25.0000 mg | Freq: Once | INTRAMUSCULAR | Status: AC
  Administered 2024-01-15: 25 mg via INTRAVENOUS
  Filled 2024-01-15: qty 1

## 2024-01-15 MED ORDER — ONDANSETRON HCL 4 MG/2ML IJ SOLN
4.0000 mg | Freq: Once | INTRAMUSCULAR | Status: AC
  Administered 2024-01-15: 4 mg via INTRAVENOUS

## 2024-01-15 MED ORDER — METHYLPREDNISOLONE SODIUM SUCC 125 MG IJ SOLR
125.0000 mg | Freq: Once | INTRAMUSCULAR | Status: AC
  Administered 2024-01-15: 125 mg via INTRAVENOUS
  Filled 2024-01-15: qty 2

## 2024-01-15 NOTE — ED Notes (Signed)
 Patient with 1 episode of large emesis after IV Benadryl  administration.

## 2024-01-15 NOTE — ED Notes (Signed)
 RN provided AVS using Teachback Method. Patient verbalizes understanding of Discharge Instructions. Opportunity for Questioning and Answers were provided by RN. Patient Discharged from ED ambulatory to home with family friend.
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
## 2024-01-15 NOTE — Discharge Instructions (Signed)
 You were evaluated in the Emergency Department and after careful evaluation, we did not find any emergent condition requiring admission or further testing in the hospital.  Your exam/testing today is overall reassuring.  Symptoms likely due to an allergic reaction.  Use the Pepcid  twice daily for rash or itch.  Use the prednisone  medication as prescribed with a decreasing dose.  Use the EpiPen  only for emergencies as we discussed.  Avoid seafood until you follow-up with the allergy specialist.  Please return to the Emergency Department if you experience any worsening of your condition.   Thank you for allowing us  to be a part of your care.
